# Patient Record
Sex: Female | Born: 1965 | Race: White | Hispanic: No | Marital: Married | Smoking: Current every day smoker
Health system: Southern US, Community
[De-identification: ages and names within clinical notes are randomized; demographics above are authoritative.]

## PROBLEM LIST (undated history)

## (undated) DIAGNOSIS — J45909 Unspecified asthma, uncomplicated: Secondary | ICD-10-CM

## (undated) DIAGNOSIS — R112 Nausea with vomiting, unspecified: Secondary | ICD-10-CM

## (undated) DIAGNOSIS — E039 Hypothyroidism, unspecified: Secondary | ICD-10-CM

## (undated) DIAGNOSIS — Z9889 Other specified postprocedural states: Secondary | ICD-10-CM

## (undated) HISTORY — DX: Hypothyroidism, unspecified: E03.9

## (undated) HISTORY — PX: CHOLECYSTECTOMY: SHX55

## (undated) HISTORY — PX: HERNIA REPAIR: SHX51

---

## 1998-07-18 ENCOUNTER — Ambulatory Visit (HOSPITAL_COMMUNITY): Admission: RE | Admit: 1998-07-18 | Discharge: 1998-07-18 | Payer: Self-pay | Admitting: Internal Medicine

## 1998-07-18 ENCOUNTER — Encounter: Payer: Self-pay | Admitting: Internal Medicine

## 1999-12-31 ENCOUNTER — Other Ambulatory Visit: Admission: RE | Admit: 1999-12-31 | Discharge: 1999-12-31 | Payer: Self-pay | Admitting: Internal Medicine

## 2015-12-31 ENCOUNTER — Encounter (HOSPITAL_COMMUNITY): Payer: Self-pay

## 2015-12-31 ENCOUNTER — Emergency Department (HOSPITAL_COMMUNITY)
Admission: EM | Admit: 2015-12-31 | Discharge: 2015-12-31 | Disposition: A | Discharge status: Home / Self Care | Payer: No Typology Code available for payment source | Attending: Emergency Medicine | Admitting: Emergency Medicine

## 2015-12-31 ENCOUNTER — Emergency Department (HOSPITAL_COMMUNITY): Payer: No Typology Code available for payment source

## 2015-12-31 DIAGNOSIS — F17200 Nicotine dependence, unspecified, uncomplicated: Secondary | ICD-10-CM | POA: Diagnosis not present

## 2015-12-31 DIAGNOSIS — Z79899 Other long term (current) drug therapy: Secondary | ICD-10-CM | POA: Insufficient documentation

## 2015-12-31 DIAGNOSIS — S5011XA Contusion of right forearm, initial encounter: Secondary | ICD-10-CM | POA: Insufficient documentation

## 2015-12-31 DIAGNOSIS — Y9389 Activity, other specified: Secondary | ICD-10-CM | POA: Insufficient documentation

## 2015-12-31 DIAGNOSIS — Y999 Unspecified external cause status: Secondary | ICD-10-CM | POA: Diagnosis not present

## 2015-12-31 DIAGNOSIS — S59911A Unspecified injury of right forearm, initial encounter: Secondary | ICD-10-CM | POA: Diagnosis present

## 2015-12-31 DIAGNOSIS — J45909 Unspecified asthma, uncomplicated: Secondary | ICD-10-CM | POA: Diagnosis not present

## 2015-12-31 DIAGNOSIS — S8001XA Contusion of right knee, initial encounter: Secondary | ICD-10-CM | POA: Diagnosis not present

## 2015-12-31 DIAGNOSIS — Y92410 Unspecified street and highway as the place of occurrence of the external cause: Secondary | ICD-10-CM | POA: Diagnosis not present

## 2015-12-31 DIAGNOSIS — T07XXXA Unspecified multiple injuries, initial encounter: Secondary | ICD-10-CM

## 2015-12-31 HISTORY — DX: Unspecified asthma, uncomplicated: J45.909

## 2015-12-31 MED ORDER — OXYCODONE-ACETAMINOPHEN 5-325 MG PO TABS
1.0000 | ORAL_TABLET | Freq: Once | ORAL | Status: AC
  Administered 2015-12-31: 1 via ORAL
  Filled 2015-12-31: qty 1

## 2015-12-31 MED ORDER — TRAMADOL HCL 50 MG PO TABS: 50.0000 mg | ORAL_TABLET | Freq: Four times a day (QID) | ORAL | Status: DC | PRN

## 2015-12-31 MED ORDER — IBUPROFEN 800 MG PO TABS: 800.0000 mg | ORAL_TABLET | Freq: Three times a day (TID) | ORAL | Status: DC

## 2015-12-31 MED ORDER — CYCLOBENZAPRINE HCL 10 MG PO TABS: 10.0000 mg | ORAL_TABLET | Freq: Three times a day (TID) | ORAL | Status: DC | PRN
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
## 2021-10-07 ENCOUNTER — Ambulatory Visit
Admission: EM | Admit: 2021-10-07 | Discharge: 2021-10-07 | Disposition: A | Discharge status: Home / Self Care | Payer: 59 | Attending: Family Medicine | Admitting: Family Medicine

## 2021-10-07 ENCOUNTER — Other Ambulatory Visit: Payer: Self-pay

## 2021-10-07 ENCOUNTER — Encounter: Payer: Self-pay | Admitting: Emergency Medicine

## 2021-10-07 DIAGNOSIS — M79605 Pain in left leg: Secondary | ICD-10-CM

## 2021-10-07 MED ORDER — CYCLOBENZAPRINE HCL 10 MG PO TABS: 10.0000 mg | ORAL_TABLET | Freq: Two times a day (BID) | ORAL | 0 refills | Status: DC | PRN

## 2021-10-07 NOTE — ED Triage Notes (Signed)
Pt reports left groin pain since Monday. Pt denies any known injury and reports pain is worse with standing and reports "knot" sensation intensify's between legs when standing.  ?

## 2021-10-07 NOTE — ED Provider Notes (Signed)
?[facility] ? ? ? ?CSN: [id number] ?Arrival date & time: 10/07/21  1533 ? ? ?  ? ?History   ?Chief Complaint ?Chief Complaint  ?Patient presents with  ? Groin Pain  ? ? ?HPI ?Selena Bonilla is a 56 y.o. female.  ? ?Presenting today with several day history of left upper leg pain, stiffness.  She states she is unaware of any injury to the area though has been incredibly active lately.  She states when she sits on a hard surface it feels like there is a knot to the upper posterior leg and the medial upper leg though she does not feel well when she presses on the area.  Denies any numbness, tingling, weakness, radiation of pain down the leg, discoloration, history of similar issues.  Tried Tylenol once or twice but states it did not help so she has not been trying anything over-the-counter for symptoms. ? ? ?Past Medical History:  ?Diagnosis Date  ? Asthma   ? ? ?There are no problems to display for this patient. ? ? ?Past Surgical History:  ?Procedure Laterality Date  ? CESAREAN SECTION    ? CHOLECYSTECTOMY    ? HERNIA REPAIR    ? ? ?OB History   ?No obstetric history on file. ?  ? ? ? ?Home Medications   ? ?Prior to Admission medications   ?Medication Sig Start Date End Date Taking? Authorizing Provider  ?cyclobenzaprine (FLEXERIL) 10 MG tablet Take 1 tablet (10 mg total) by mouth 2 (two) times daily as needed for muscle spasms. Do not drink alcohol or drive while taking this medication.  May cause drowsiness. 10/07/21  Yes Particia NearingLane, Signora Zucco Elizabeth, PA-C  ?albuterol (PROVENTIL HFA;VENTOLIN HFA) 108 (90 Base) MCG/ACT inhaler Inhale into the lungs every 6 (six) hours as needed for wheezing or shortness of breath.    [provider]  ?cyclobenzaprine (FLEXERIL) 10 MG tablet Take 1 tablet (10 mg total) by mouth 3 (three) times daily as needed for muscle spasms. 12/31/15   Gilda CreasePollina, Christopher J, MD  ?Fluticasone-Salmeterol (ADVAIR) 100-50 MCG/DOSE AEPB Inhale 1 puff into the lungs 2 (two) times daily.     [provider]  ?ibuprofen (ADVIL,MOTRIN) 800 MG tablet Take 1 tablet (800 mg total) by mouth 3 (three) times daily. 12/31/15   Gilda CreasePollina, Christopher J, MD  ?traMADol (ULTRAM) 50 MG tablet Take 1 tablet (50 mg total) by mouth every 6 (six) hours as needed. 12/31/15   Gilda CreasePollina, Christopher J, MD  ? ? ?Family History ?History reviewed. No pertinent family history. ? ?Social History ?Social History  ? ?Tobacco Use  ? Smoking status: Every Day  ?Substance Use Topics  ? Alcohol use: No  ? ? ? ?Allergies   ?Patient has no known allergies. ? ? ?Review of Systems ?Review of Systems ?Per HPI ? ?Physical Exam ?Triage Vital Signs ?ED Triage Vitals  ?Enc Vitals Group  ?   BP 10/07/21 1603 136/80  ?   Pulse Rate 10/07/21 1603 100  ?   Resp 10/07/21 1603 18  ?   Temp 10/07/21 1603 98.1 ?F (36.7 ?C)  ?   Temp Source 10/07/21 1603 Oral  ?   SpO2 10/07/21 1603 94 %  ?   Weight 10/07/21 1604 175 lb (79.4 kg)  ?   Height 10/07/21 1604 5\' 10"  (1.778 m)  ?   Head Circumference --   ?   Peak Flow --   ?   Pain Score 10/07/21 1604 3  ?  Pain Loc --   ?   Pain Edu? --   ?   Excl. in GC? --   ? ?No data found. ? ?Updated Vital Signs ?BP 136/80 (BP Location: Right Arm)   Pulse 100   Temp 98.1 ?F (36.7 ?C) (Oral)   Resp 18   Ht 5\' 10"  (1.778 m)   Wt 175 lb (79.4 kg)   SpO2 94%   BMI 25.11 kg/m?  ? ?Visual Acuity ?Right Eye Distance:   ?Left Eye Distance:   ?Bilateral Distance:   ? ?Right Eye Near:   ?Left Eye Near:    ?Bilateral Near:    ? ?Physical Exam ?Vitals and nursing note reviewed.  ?Constitutional:   ?   Appearance: Normal appearance. She is not ill-appearing.  ?HENT:  ?   Head: Atraumatic.  ?   Mouth/Throat:  ?   Mouth: Mucous membranes are moist.  ?Eyes:  ?   Extraocular Movements: Extraocular movements intact.  ?   Conjunctiva/sclera: Conjunctivae normal.  ?Cardiovascular:  ?   Rate and Rhythm: Normal rate and regular rhythm.  ?   Heart sounds: Normal heart sounds.  ?Pulmonary:  ?   Effort: Pulmonary effort is normal.   ?   Breath sounds: Normal breath sounds.  ?Musculoskeletal:     ?   General: Tenderness present. No swelling, deformity or signs of injury. Normal range of motion.  ?   Cervical back: Normal range of motion and neck supple.  ?   Comments: Tender to palpation left central posterior upper leg and left medial upper leg though no palpable masses in this area, discoloration, skin changes.  Range of motion full and intact, strength full and intact bilateral lower legs ?Negative Homans' sign, negative squeeze test bilateral lower legs  ?Skin: ?   General: Skin is warm and dry.  ?   Findings: No bruising or erythema.  ?Neurological:  ?   Mental Status: She is alert and oriented to person, place, and time.  ?   Motor: No weakness.  ?   Gait: Gait normal.  ?   Comments: Bilateral lower extremities neurovascularly intact  ?Psychiatric:     ?   Mood and Affect: Mood normal.     ?   Thought Content: Thought content normal.     ?   Judgment: Judgment normal.  ? ? ? ?UC Treatments / Results  ?Labs ?(all labs ordered are listed, but only abnormal results are displayed) ?Labs Reviewed - No data to display ? ?EKG ? ? ?Radiology ?No results found. ? ?Procedures ?Procedures (including critical care time) ? ?Medications Ordered in UC ?Medications - No data to display ? ?Initial Impression / Assessment and Plan / UC Course  ?I have reviewed the triage vital signs and the nursing notes. ? ?Pertinent labs & imaging results that were available during my care of the patient were reviewed by me and considered in my medical decision making (see chart for details). ? ?  ? ?Exam and symptoms consistent with muscular etiology of pain, no red flag findings today on exam and vital signs benign and reassuring.  We will treat with Flexeril, heat, stretches, massage, rest and monitor closely for symptomatic improvement.  Discussed ED for any worsening symptoms, PCP follow-up next week for recheck. ? ?Final Clinical Impressions(s) / UC Diagnoses   ? ?Final diagnoses:  ?Left leg pain  ? ?Discharge Instructions   ?None ?  ? ?ED Prescriptions   ? ? Medication Sig Dispense Auth. Provider  ?  cyclobenzaprine (FLEXERIL) 10 MG tablet Take 1 tablet (10 mg total) by mouth 2 (two) times daily as needed for muscle spasms. Do not drink alcohol or drive while taking this medication.  May cause drowsiness. 20 tablet Particia Nearing, [place]  ? ?  ? ?PDMP not reviewed this encounter. ?  ?Particia Nearing, PA-C ?10/07/21 1711 ? ?

## 2022-06-24 ENCOUNTER — Other Ambulatory Visit: Payer: Self-pay

## 2022-06-24 ENCOUNTER — Emergency Department (HOSPITAL_COMMUNITY): Payer: 59

## 2022-06-24 ENCOUNTER — Emergency Department (HOSPITAL_COMMUNITY)
Admission: EM | Admit: 2022-06-24 | Discharge: 2022-06-25 | Disposition: A | Discharge status: Home / Self Care | Payer: 59 | Attending: Emergency Medicine | Admitting: Emergency Medicine

## 2022-06-24 ENCOUNTER — Encounter (HOSPITAL_COMMUNITY): Payer: Self-pay | Admitting: *Deleted

## 2022-06-24 DIAGNOSIS — W01198A Fall on same level from slipping, tripping and stumbling with subsequent striking against other object, initial encounter: Secondary | ICD-10-CM | POA: Insufficient documentation

## 2022-06-24 DIAGNOSIS — S42432A Displaced fracture (avulsion) of lateral epicondyle of left humerus, initial encounter for closed fracture: Secondary | ICD-10-CM | POA: Diagnosis not present

## 2022-06-24 DIAGNOSIS — M7989 Other specified soft tissue disorders: Secondary | ICD-10-CM | POA: Diagnosis present

## 2022-06-24 MED ORDER — HYDROCODONE-ACETAMINOPHEN 5-325 MG PO TABS
2.0000 | ORAL_TABLET | Freq: Once | ORAL | Status: AC
  Administered 2022-06-24: 2 via ORAL
  Filled 2022-06-24: qty 2

## 2022-06-24 MED ORDER — HYDROCODONE-ACETAMINOPHEN 5-325 MG PO TABS: 1.0000 | ORAL_TABLET | ORAL | 0 refills | Status: DC | PRN

## 2022-06-24 NOTE — ED Notes (Signed)
Pt ambulatory to bathroom with spouse and back to room

## 2022-06-24 NOTE — ED Triage Notes (Signed)
Pt fell today while cleaning out a van, pt with left elbow pain and left wrist pain.

## 2022-06-24 NOTE — ED Provider Notes (Signed)
[facility] EMERGENCY DEPARTMENT Provider Note   CSN: [id number] Arrival date & time: 06/24/22  1633     History  Chief Complaint  Patient presents with   Selena Bonilla is a 56 y.o. female.  Patient reports that she slipped and fell and struck her left elbow.  Patient complains of swelling and pain.  Patient reports pain radiates down to her wrist and hand.  Patient did not strike her head she denies any other areas of injury.  The history is provided by the patient. No language interpreter was used.  Fall This is a new problem. The current episode started 6 to 12 hours ago. The problem occurs constantly. The problem has not changed since onset.Nothing aggravates the symptoms. Nothing relieves the symptoms. She has tried nothing for the symptoms. The treatment provided no relief.       Home Medications Prior to Admission medications   Medication Sig Start Date End Date Taking? Authorizing Provider  albuterol (PROVENTIL HFA;VENTOLIN HFA) 108 (90 Base) MCG/ACT inhaler Inhale into the lungs every 6 (six) hours as needed for wheezing or shortness of breath.    [provider]  cyclobenzaprine (FLEXERIL) 10 MG tablet Take 1 tablet (10 mg total) by mouth 3 (three) times daily as needed for muscle spasms. 12/31/15   Gilda Crease, MD  cyclobenzaprine (FLEXERIL) 10 MG tablet Take 1 tablet (10 mg total) by mouth 2 (two) times daily as needed for muscle spasms. Do not drink alcohol or drive while taking this medication.  May cause drowsiness. 10/07/21   Particia Nearing, PA-C  Fluticasone-Salmeterol (ADVAIR) 100-50 MCG/DOSE AEPB Inhale 1 puff into the lungs 2 (two) times daily.    [provider]  ibuprofen (ADVIL,MOTRIN) 800 MG tablet Take 1 tablet (800 mg total) by mouth 3 (three) times daily. 12/31/15   Gilda Crease, MD  traMADol (ULTRAM) 50 MG tablet Take 1 tablet (50 mg total) by mouth every 6 (six) hours as needed. 12/31/15   Gilda Crease, MD      Allergies    Patient has no known allergies.    Review of Systems   Review of Systems  All other systems reviewed and are negative.   Physical Exam Updated Vital Signs BP 130/87   Pulse 78   Temp 97.8 F (36.6 C) (Oral)   Resp 17   Ht 5\' 10"  (1.778 m)   Wt 96.2 kg   SpO2 100%   BMI 30.42 kg/m  Physical Exam Vitals and nursing note reviewed.  Constitutional:      Appearance: She is well-developed.  HENT:     Head: Normocephalic.     Mouth/Throat:     Mouth: Mucous membranes are moist.  Pulmonary:     Effort: Pulmonary effort is normal.  Abdominal:     General: There is no distension.  Musculoskeletal:        General: Swelling and tenderness present.     Cervical back: Normal range of motion.  Neurological:     Mental Status: She is alert and oriented to person, place, and time.     ED Results / Procedures / Treatments   Labs (all labs ordered are listed, but only abnormal results are displayed) Labs Reviewed - No data to display  EKG None  Radiology CT Elbow Left Wo Contrast  Result Date: 06/24/2022 CLINICAL DATA:  Fracture elbow. Patient fell today while cleaning out a vein. Left elbow pain. EXAM: CT OF THE  UPPER LEFT EXTREMITY WITHOUT CONTRAST TECHNIQUE: Multidetector CT imaging of the upper left extremity was performed according to the standard protocol. RADIATION DOSE REDUCTION: This exam was performed according to the departmental dose-optimization program which includes automated exposure control, adjustment of the mA and/or kV according to patient size and/or use of iterative reconstruction technique. COMPARISON:  None Available. FINDINGS: Bones/Joint/Cartilage Highly comminuted fracture of the lateral epicondyle, trochlea as well as capitellum. 2.0 x 1.2 cm displaced osseous fragment about the anterior aspect of the distal humerus. There is also 0.9 x 0.6 cm posteriorly displaced fragment likely from the trochlea. There is mildly  displaced fracture of the radial head. There is contour irregularity about the insertion of the triceps tendon concerning for avulsion fracture of the ulna. Moderate-sized elbow joint effusion. Ligaments Suboptimally assessed by CT. Muscles and Tendons Muscles are normal in bulk. No intramuscular hematoma or fluid collection. Irregularity about the insertion of the triceps tendon concerning for avulsion fracture. Evaluation of biceps tendon is limited due to positioning. Soft tissues Marked subcutaneous soft tissue edema about the medial and lateral aspect of the elbow. No appreciable fluid collection or hematoma. IMPRESSION: 1. Highly comminuted fracture of the distal humerus involving the lateral epicondyle, trochlea as well as capitellum with displaced osseous fragments as described above. 2. Mildly displaced fracture of the radial head. 3. Contour irregularity about the insertion of the triceps tendon concerning for avulsion fracture of the ulnar. 4. Moderate-sized elbow joint effusion. 5. Marked subcutaneous soft tissue edema about the medial and lateral aspect of the elbow. Electronically Signed   By: Larose Hires D.O.   On: 06/24/2022 22:52   DG Wrist Complete Left  Result Date: 06/24/2022 CLINICAL DATA:  Fall, wrist pain EXAM: LEFT WRIST - COMPLETE 3+ VIEW COMPARISON:  12/31/2015 FINDINGS: Mild spurring at the first carpometacarpal articulation. No well-defined fracture is identified. Please note that a dedicated scaphoid view was not obtained. IMPRESSION: 1. No well-defined fracture is identified. Please note that a dedicated scaphoid view was not obtained. 2. Mild spurring at the first carpometacarpal articulation. Electronically Signed   By: Gaylyn Rong M.D.   On: 06/24/2022 18:11   DG Elbow Complete Left  Result Date: 06/24/2022 CLINICAL DATA:  Fall, left elbow pain and wrist pain. EXAM: LEFT ELBOW - COMPLETE 3+ VIEW COMPARISON:  12/31/2015 FINDINGS: Comminuted fracture the lateral  humeral epicondyle, trochlea, and capitellum with one fragment displaced laterally as shown on the frontal projection, and a large articular fragment displaced anteriorly best seen on the lateral projection. There is clustered irregular posterior fragments posterior to the elbow joint along with elbow joint effusion and questionable additional fragments. Although most of this appears to have a lateral donor site, a supracondylar fracture is not excluded given the degree of deformity and irregularity in the region. CT may help further clarify. IMPRESSION: 1. Comminuted fracture of the lateral humeral epicondyle, trochlea, and capitellum with one fragment displaced laterally and a large articular fragment anteriorly displaced anteriorly. 2. Clustered irregular fragments posterior to the elbow joint along with elbow joint effusion. Although most of this appears to have a lateral donor site, a supracondylar fracture is not excluded. Consider CT for further characterization. Electronically Signed   By: Gaylyn Rong M.D.   On: 06/24/2022 18:09    Procedures Procedures    Medications Ordered in ED Medications  HYDROcodone-acetaminophen (NORCO/VICODIN) 5-325 MG per tablet 2 tablet (2 tablets Oral Given 06/24/22 2155)    ED Course/ Medical Decision Making/ A&P  Medical Decision Making Patient reports she slipped and fell while cleaning out her Zenaida Niece.  Patient reports striking her elbow and hearing a pop  Amount and/or Complexity of Data Reviewed Independent Historian: spouse    Details: Patient is here with her husband who is supportive Radiology: ordered and independent interpretation performed. Decision-making details documented in ED Course.    Details: X-ray of left elbow shows lateral epicondyles fracture.  Radiologist suggested CT scan.  CT scan shows highly comminuted fracture lateral epicondyles.  Risk Prescription drug management. Risk Details: Patient placed in  a posterior splint.  Patient is given 2 hydrocodone here for pain patient is given a prescription for hydrocodone.  She is advised to call Dr. Romeo Apple orthopedist on Monday to schedule appointment for evaluation.  Patient is given a prescription for hydrocodone.  Patient is advised ice to area of swelling           Final Clinical Impression(s) / ED Diagnoses Final diagnoses:  Closed displaced fracture of lateral epicondyle of left humerus, unspecified fracture morphology, initial encounter    Rx / DC Orders ED Discharge Orders          Ordered    HYDROcodone-acetaminophen (NORCO/VICODIN) 5-325 MG tablet  Every 4 hours PRN        06/24/22 2307           An After Visit Summary was printed and given to the patient.    Osie Cheeks 06/24/22 2310    Rondel Baton, MD 06/26/22 1538

## 2022-06-24 NOTE — Discharge Instructions (Signed)
Ice to area of swelling.  Schedule to see the Orthopaedist for evaluation.

## 2022-06-27 ENCOUNTER — Telehealth: Payer: Self-pay | Admitting: Orthopedic Surgery

## 2022-06-27 NOTE — Telephone Encounter (Signed)
Patient called and left a voicemail stating she fell and broke her elbow last Friday and needs an appt   I called the patient back and got her voicemail left message to call the office

## 2022-06-29 ENCOUNTER — Ambulatory Visit: Payer: 59 | Admitting: Orthopedic Surgery

## 2022-06-29 ENCOUNTER — Encounter: Payer: Self-pay | Admitting: Orthopedic Surgery

## 2022-06-29 VITALS — BP 130/87 | Ht 70.0 in | Wt 212.0 lb

## 2022-06-29 DIAGNOSIS — S42412A Displaced simple supracondylar fracture without intercondylar fracture of left humerus, initial encounter for closed fracture: Secondary | ICD-10-CM

## 2022-06-29 NOTE — Progress Notes (Signed)
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  She comes in with a ill fitting posterior splint complaining of trouble sleeping at night because of the injury and positioning.  Otherwise during the day hydrocodone has controlled the pain  She comes in for evaluation and management  The review of systems revealed no chest pain or shortness of breath no fever no easy bruising or bleeding no anesthetic complications noted in her family history  She does have smoking history smokes every day cigarettes.  She has not been tested for osteoporosis  Past Medical History:  Diagnosis Date   Asthma    Past Surgical History:  Procedure Laterality Date   CESAREAN SECTION     CHOLECYSTECTOMY     HERNIA REPAIR     History reviewed. No pertinent family history. Social History   Tobacco Use   Smoking status: Every Day    Types: Cigarettes  Substance Use Topics   Alcohol use: No   No Known Allergies BP 130/87 Comment: in ER  Ht 5\' 10"  (1.778 m)   Wt 212 lb (96.2 kg)   BMI 30.42 kg/m   Physical Exam Vitals and nursing note reviewed.  Constitutional:      Appearance: Normal appearance.  HENT:     Head: Normocephalic and atraumatic.  Eyes:     General: No scleral icterus.       Right eye: No discharge.        Left eye: No discharge.     Extraocular Movements: Extraocular movements intact.     Conjunctiva/sclera: Conjunctivae normal.     Pupils: Pupils are equal, round, and reactive to light.  Cardiovascular:     Rate and Rhythm: Normal rate.     Pulses: Normal pulses.  Musculoskeletal:     Comments: Left elbow splint was removed and skin was checked there was a faint hue of yellow discoloration in the arm around the elbow secondary to fracture related bleeding and swelling.  The skin was otherwise intact.  The median ulnar and radial  nerve sensation was intact.  She can move her hand but she had some stiffness from swelling and from the application of the splint    Skin:    General: Skin is warm and dry.     Capillary Refill: Capillary refill takes less than 2 seconds.  Neurological:     General: No focal deficit present.     Mental Status: She is alert and oriented to person, place, and time.  Psychiatric:        Mood and Affect: Mood normal.        Behavior: Behavior normal.        Thought Content: Thought content normal.        Judgment: Judgment normal.     Outside imaging requires personal interpretation: My interpretation is: Plain radiographs show a comminuted fracture of the left elbow  CT scan was also evaluated and it showed the same findings with Displaced radial head fracture lateral epicondyle trochlea and capitellar fracture   Encounter Diagnosis  Name Primary?   Closed supracondylar fracture of left elbow, initial encounter Yes    CT scan report 1. Highly comminuted fracture of the distal humerus involving the lateral epicondyle, trochlea as well as capitellum with displaced osseous fragments as described above. 2. Mildly  displaced fracture of the radial head. 3. Contour irregularity about the insertion of the triceps tendon concerning for avulsion fracture of the ulnar. 4. Moderate-sized elbow joint effusion. 5. Marked subcutaneous soft tissue edema about the medial and lateral aspect of the elbow.     Electronically Signed   By: Larose Hires D.O.   On: 06/24/2022 22:52    As this fracture requires a high level of expertise for reconstruction of the elbow I have informed the patient that we will make a referral.  She is in agreement.  We redid her splint to a more appropriate forming and fitting splint

## 2022-06-29 NOTE — Patient Instructions (Signed)
[facility] will call you with appointment. If you have not heard from them in 2-3 business days call them to schedule [phone]   

## 2022-06-30 ENCOUNTER — Encounter (HOSPITAL_BASED_OUTPATIENT_CLINIC_OR_DEPARTMENT_OTHER): Payer: Self-pay | Admitting: Orthopaedic Surgery

## 2022-07-01 NOTE — Telephone Encounter (Signed)
I called her to discuss referral [facility] could not help her She has established with Dr Everardo Pacific   To you FYI only

## 2022-07-03 NOTE — H&P (Signed)
PREOPERATIVE H&P  Chief Complaint: left elbow radial head fracture, ligament tear, unicondylar elbow fracture  HPI: Selena Bonilla is a 56 y.o. female who is scheduled for, Procedure(s): OPEN REDUCTION INTERNAL FIXATION (ORIF) DISTAL HUMERUS FRACTURE OPEN REDUCTION INTERNAL FIXATION (ORIF) RADIAL FRACTURE TRICEPS TENDON REPAIR LIGAMENT REPAIR.   Patient has a past medical history significant for PONV.   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 She went to [facility] ED. Work-up in the ED was significant for distal humerus fracture. She was splinted and told to follow-up with Orthopedics.   Symptoms are rated as moderate to severe, and have been worsening.  This is significantly impairing activities of daily living.    Please see clinic note for further details on this patient's care.    She has elected for surgical management.   Past Medical History:  Diagnosis Date   Asthma    PONV (postoperative nausea and vomiting)    Past Surgical History:  Procedure Laterality Date   CESAREAN SECTION     CHOLECYSTECTOMY     HERNIA REPAIR     Social History   Socioeconomic History   Marital status: Married    Spouse name: Not on file   Number of children: Not on file   Years of education: Not on file   Highest education level: Not on file  Occupational History   Not on file  Tobacco Use   Smoking status: Every Day    Packs/day: 0.50    Types: Cigarettes   Smokeless tobacco: Not on file  Substance and Sexual Activity   Alcohol use: Yes    Comment: occasional   Drug use: Never   Sexual activity: Not on file  Other Topics Concern   Not on file  Social History Narrative   Not on file   Social Determinants of Health   Financial Resource Strain: Not on file  Food Insecurity: Not on file  Transportation Needs: Not on file  Physical Activity: Not on file  Stress: Not on file  Social Connections: Not on file   History reviewed.  No pertinent family history. No Known Allergies Prior to Admission medications   Medication Sig Start Date End Date Taking? Authorizing Provider  albuterol (PROVENTIL HFA;VENTOLIN HFA) 108 (90 Base) MCG/ACT inhaler Inhale into the lungs every 6 (six) hours as needed for wheezing or shortness of breath.   Yes [provider]  budesonide-formoterol (SYMBICORT) 80-4.5 MCG/ACT inhaler Inhale 2 puffs into the lungs 2 (two) times daily.   Yes [provider]  buPROPion (WELLBUTRIN XL) 150 MG 24 hr tablet Take 150 mg by mouth daily. 04/06/22  Yes [provider]  ibuprofen (ADVIL,MOTRIN) 800 MG tablet Take 1 tablet (800 mg total) by mouth 3 (three) times daily. 12/31/15  Yes Pollina, Canary Brim, MD  Fluticasone-Salmeterol (ADVAIR) 100-50 MCG/DOSE AEPB Inhale 1 puff into the lungs 2 (two) times daily. Patient not taking: Reported on 06/30/2022    [provider]  HYDROcodone-acetaminophen (NORCO/VICODIN) 5-325 MG tablet Take 1 tablet by mouth every 4 (four) hours as needed for moderate pain. 06/24/22 06/24/23  Elson Areas, PA-C    ROS: All other systems have been reviewed and were otherwise negative with the exception of those mentioned in the HPI and as above.  Physical Exam: General: Alert, no acute distress Cardiovascular: No pedal edema Respiratory: No cyanosis, no use of accessory musculature GI: No organomegaly, abdomen is soft and non-tender  Skin: No lesions in the area of chief complaint Neurologic: Sensation intact distally Psychiatric: Patient is competent for consent with normal mood and affect Lymphatic: No axillary or cervical lymphadenopathy  MUSCULOSKELETAL:  LUE: Splint CDI. Skin intact though cannot assess fully beneath splint. Nontender to palpation proximally, with full and painless ROM throughout hand. Distal motor and sensory function grossly intact. Well perfused digits.    Imaging: CT of the left elbow demonstrates a comminuted  fracture of the distal humerus, mildly displaced radial head fracture, triceps tendon tear  Assessment: left elbow radial head fracture, ligament tear, unicondylar elbow fracture  Plan: Plan for Procedure(s): OPEN REDUCTION INTERNAL FIXATION (ORIF) DISTAL HUMERUS FRACTURE OPEN REDUCTION INTERNAL FIXATION (ORIF) RADIAL FRACTURE TRICEPS TENDON REPAIR LIGAMENT REPAIR  The risks benefits and alternatives were discussed with the patient including but not limited to the risks of nonoperative treatment, versus surgical intervention including infection, bleeding, nerve injury,  blood clots, cardiopulmonary complications, morbidity, mortality, among others, and they were willing to proceed.   The patient acknowledged the explanation, agreed to proceed with the plan and consent was signed.   Operative Plan: ORIF distal humerus and radial head, lateral ligament repair, triceps tendon repair Discharge Medications: Standard DVT Prophylaxis: none Physical Therapy: outpatient PT Special Discharge needs: Splint. Sling    Vernetta Honey, PA-C  07/03/2022 2:05 PM

## 2022-07-03 NOTE — Discharge Instructions (Addendum)
Ramond Marrow MD, MPH Alfonse Alpers, PA-C [facility] [address], [address] [phone] (tel)   [phone] (fax)   POST-OPERATIVE INSTRUCTIONS   WOUND CARE Please keep splint clean dry and intact until followup.  You may shower on Post-Op Day #3.  You must keep splint dry during this process and may find that a plastic bag taped around the extremity or alternatively a towel based bath may be a better option.   If you get your splint wet or if it is damaged please contact our clinic.  EXERCISES Due to your splint being in place you will not be able to bear weight through your extremity.    You may use a sling for comfort   It is normal for your fingers/hand to become more swollen after surgery and discolored from bruising.   This will resolve over the first few weeks usually after surgery. Please continue to ambulate and do not stay sitting or lying for too long.  Perform foot and wrist pumps to assist in circulation.   REGIONAL ANESTHESIA (NERVE BLOCKS) The anesthesia team may have performed a nerve block for you this is a great tool used to minimize pain.   The block may start wearing off overnight (between 8-24 hours postop) When the block wears off, your pain may go from nearly zero to the pain you would have had postop without the block. This is an abrupt transition but nothing dangerous is happening.   This can be a challenging period but utilize your as needed pain medications to try and manage this period. We suggest you use the pain medication the first night prior to going to bed, to ease this transition.  You may take an extra dose of narcotic when this happens if needed   POST-OP MEDICATIONS- Multimodal approach to pain control In general your pain will be controlled with a combination of substances.  Prescriptions unless otherwise discussed are electronically sent to your pharmacy.  This is a carefully made plan we use to minimize  narcotic use.     Celebrex - Anti-inflammatory medication taken on a scheduled basis Acetaminophen - Non-narcotic pain medicine taken on a scheduled basis  Gabapentin - this is to help with nerve based pain, take on a scheduled basis Oxycodone - This is a strong narcotic, to be used only on an "as needed" basis for SEVERE pain. Zofran - take as needed for nausea   FOLLOW-UP If you develop a Fever (>101.5), Redness or Drainage from the surgical incision site, please call our office to arrange for an evaluation. Please call the office to schedule a follow-up appointment for your incision check if you do not already have one, 7-10 days post-operatively.   HELPFUL INFORMATION    You may be more comfortable sleeping in a semi-seated position the first few nights following surgery.  Keep a pillow propped under the elbow and forearm for comfort.  If you have a recliner type of chair it might be beneficial.  If not that is fine too, but it would be helpful to sleep propped up with pillows behind your operated shoulder as well under your elbow and forearm.  This will reduce pulling on the suture lines.   When dressing, put your operative arm in the sleeve first.  When getting undressed, take your operative arm out last.  Loose fitting, button-down shirts are recommended.  Often in the first days after surgery you may be more comfortable keeping your operative arm under your  shirt and not through the sleeve.   You may return to work/school in the next couple of days when you feel up to it.  Desk work and typing in the sling is fine.   We suggest you use the pain medication the first night prior to going to bed, in order to ease any pain when the anesthesia wears off. You should avoid taking pain medications on an empty stomach as it will make you nauseous.   You should wean off your narcotic medicines as soon as you are able.  Most patients will be off or using minimal narcotics before their first  postop appointment.    Do not drink alcoholic beverages or take illicit drugs when taking pain medications.   It is against the law to drive while taking narcotics.  In some states it is against the law to drive while your arm is in a sling.    Pain medication may make you constipated.  Below are a few solutions to try in this order:   - Decrease the amount of pain medication if you aren't having pain.   - Drink lots of decaffeinated fluids.   - Drink prune juice and/or each dried prunes   If the first 3 don't work start with additional solutions   - Take Colace - an over-the-counter stool softener   - Take Senokot - an over-the-counter laxative   - Take Miralax - a stronger over-the-counter laxative   For more information including helpful videos and documents visit our website:   [email or web address]    Post Anesthesia Home Care Instructions  Activity: Get plenty of rest for the remainder of the day. A responsible individual must stay with you for 24 hours following the procedure.  For the next 24 hours, DO NOT: -Drive a car -Advertising copywriter -Drink alcoholic beverages -Take any medication unless instructed by your physician -Make any legal decisions or sign important papers.  Meals: Start with liquid foods such as gelatin or soup. Progress to regular foods as tolerated. Avoid greasy, spicy, heavy foods. If nausea and/or vomiting occur, drink only clear liquids until the nausea and/or vomiting subsides. Call your physician if vomiting continues.  Special Instructions/Symptoms: Your throat may feel dry or sore from the anesthesia or the breathing tube placed in your throat during surgery. If this causes discomfort, gargle with warm salt water. The discomfort should disappear within 24 hours.  If you had a scopolamine patch placed behind your ear for the management of post- operative nausea and/or vomiting:  1. The medication in the patch is  effective for 72 hours, after which it should be removed.  Wrap patch in a tissue and discard in the trash. Wash hands thoroughly with soap and water. 2. You may remove the patch earlier than 72 hours if you experience unpleasant side effects which may include dry mouth, dizziness or visual disturbances. 3. Avoid touching the patch. Wash your hands with soap and water after contact with the patch.     Regional Anesthesia Blocks  1. Numbness or the inability to move the "blocked" extremity may last from 3-48 hours after placement. The length of time depends on the medication injected and your individual response to the medication. If the numbness is not going away after 48 hours, call your surgeon.  2. The extremity that is blocked will need to be protected until the numbness is gone and the  Strength has returned. Because you cannot feel it, you will need to take extra care  to avoid injury. Because it may be weak, you may have difficulty moving it or using it. You may not know what position it is in without looking at it while the block is in effect.  3. For blocks in the legs and feet, returning to weight bearing and walking needs to be done carefully. You will need to wait until the numbness is entirely gone and the strength has returned. You should be able to move your leg and foot normally before you try and bear weight or walk. You will need someone to be with you when you first try to ensure you do not fall and possibly risk injury.  4. Bruising and tenderness at the needle site are common side effects and will resolve in a few days.  5. Persistent numbness or new problems with movement should be communicated to the surgeon or the [facility] [phone] [facility] [phone]). Next tylenol dose any time. Next motrin dose 8pm.

## 2022-07-05 ENCOUNTER — Encounter (HOSPITAL_BASED_OUTPATIENT_CLINIC_OR_DEPARTMENT_OTHER): Payer: Self-pay | Admitting: Orthopaedic Surgery

## 2022-07-05 ENCOUNTER — Ambulatory Visit (HOSPITAL_BASED_OUTPATIENT_CLINIC_OR_DEPARTMENT_OTHER)
Admission: RE | Admit: 2022-07-05 | Discharge: 2022-07-05 | Disposition: A | Discharge status: Home / Self Care | Payer: 59 | Attending: Orthopaedic Surgery | Admitting: Orthopaedic Surgery

## 2022-07-05 ENCOUNTER — Other Ambulatory Visit: Payer: Self-pay

## 2022-07-05 ENCOUNTER — Ambulatory Visit (HOSPITAL_BASED_OUTPATIENT_CLINIC_OR_DEPARTMENT_OTHER): Payer: 59 | Admitting: Certified Registered"

## 2022-07-05 ENCOUNTER — Ambulatory Visit (HOSPITAL_COMMUNITY): Payer: 59

## 2022-07-05 ENCOUNTER — Encounter (HOSPITAL_BASED_OUTPATIENT_CLINIC_OR_DEPARTMENT_OTHER)
Admission: RE | Disposition: A | Discharge status: Home / Self Care | Payer: Self-pay | Source: Home / Self Care | Attending: Orthopaedic Surgery

## 2022-07-05 ENCOUNTER — Ambulatory Visit (HOSPITAL_BASED_OUTPATIENT_CLINIC_OR_DEPARTMENT_OTHER): Payer: 59

## 2022-07-05 DIAGNOSIS — W19XXXA Unspecified fall, initial encounter: Secondary | ICD-10-CM | POA: Insufficient documentation

## 2022-07-05 DIAGNOSIS — S52122A Displaced fracture of head of left radius, initial encounter for closed fracture: Secondary | ICD-10-CM | POA: Insufficient documentation

## 2022-07-05 DIAGNOSIS — S53422A Ulnohumeral (joint) sprain of left elbow, initial encounter: Secondary | ICD-10-CM | POA: Insufficient documentation

## 2022-07-05 DIAGNOSIS — F17210 Nicotine dependence, cigarettes, uncomplicated: Secondary | ICD-10-CM | POA: Diagnosis not present

## 2022-07-05 DIAGNOSIS — S46312A Strain of muscle, fascia and tendon of triceps, left arm, initial encounter: Secondary | ICD-10-CM | POA: Insufficient documentation

## 2022-07-05 DIAGNOSIS — J45909 Unspecified asthma, uncomplicated: Secondary | ICD-10-CM | POA: Insufficient documentation

## 2022-07-05 HISTORY — DX: Other specified postprocedural states: Z98.890

## 2022-07-05 HISTORY — PX: LIGAMENT REPAIR: SHX5444

## 2022-07-05 HISTORY — DX: Nausea with vomiting, unspecified: Z98.890

## 2022-07-05 HISTORY — DX: Nausea with vomiting, unspecified: R11.2

## 2022-07-05 HISTORY — PX: TRICEPS TENDON REPAIR: SHX2577

## 2022-07-05 HISTORY — PX: ORIF HUMERUS FRACTURE: SHX2126

## 2022-07-05 SURGERY — OPEN REDUCTION INTERNAL FIXATION (ORIF) DISTAL HUMERUS FRACTURE
Anesthesia: Regional | Site: Elbow | Laterality: Left

## 2022-07-05 MED ORDER — DEXAMETHASONE SODIUM PHOSPHATE 10 MG/ML IJ SOLN
INTRAMUSCULAR | Status: DC | PRN
  Administered 2022-07-05: 10 mg via INTRAVENOUS

## 2022-07-05 MED ORDER — PHENYLEPHRINE HCL (PRESSORS) 10 MG/ML IV SOLN
INTRAVENOUS | Status: AC
  Filled 2022-07-05: qty 1

## 2022-07-05 MED ORDER — CELECOXIB 100 MG PO CAPS: 100.0000 mg | ORAL_CAPSULE | Freq: Two times a day (BID) | ORAL | 0 refills | Status: AC

## 2022-07-05 MED ORDER — LACTATED RINGERS IV SOLN: INTRAVENOUS | Status: DC

## 2022-07-05 MED ORDER — FENTANYL CITRATE (PF) 100 MCG/2ML IJ SOLN
INTRAMUSCULAR | Status: AC
  Filled 2022-07-05: qty 2

## 2022-07-05 MED ORDER — LIDOCAINE 2% (20 MG/ML) 5 ML SYRINGE
INTRAMUSCULAR | Status: DC | PRN
  Administered 2022-07-05: 40 mg via INTRAVENOUS

## 2022-07-05 MED ORDER — OXYCODONE HCL 5 MG PO TABS: ORAL_TABLET | ORAL | 0 refills | Status: AC

## 2022-07-05 MED ORDER — IPRATROPIUM-ALBUTEROL 0.5-2.5 (3) MG/3ML IN SOLN
RESPIRATORY_TRACT | Status: AC
  Filled 2022-07-05: qty 3

## 2022-07-05 MED ORDER — PROPOFOL 10 MG/ML IV BOLUS
INTRAVENOUS | Status: AC
  Filled 2022-07-05: qty 20

## 2022-07-05 MED ORDER — BUPIVACAINE-EPINEPHRINE (PF) 0.5% -1:200000 IJ SOLN
INTRAMUSCULAR | Status: DC | PRN
  Administered 2022-07-05: 30 mL via PERINEURAL

## 2022-07-05 MED ORDER — SUGAMMADEX SODIUM 200 MG/2ML IV SOLN
INTRAVENOUS | Status: DC | PRN
  Administered 2022-07-05: 200 mg via INTRAVENOUS

## 2022-07-05 MED ORDER — ALBUTEROL SULFATE HFA 108 (90 BASE) MCG/ACT IN AERS
INHALATION_SPRAY | RESPIRATORY_TRACT | Status: DC | PRN
  Administered 2022-07-05: 8 via RESPIRATORY_TRACT

## 2022-07-05 MED ORDER — ONDANSETRON HCL 4 MG/2ML IJ SOLN
INTRAMUSCULAR | Status: AC
  Filled 2022-07-05: qty 2

## 2022-07-05 MED ORDER — KETOROLAC TROMETHAMINE 30 MG/ML IJ SOLN
30.0000 mg | Freq: Once | INTRAMUSCULAR | Status: AC | PRN
  Administered 2022-07-05: 30 mg via INTRAVENOUS

## 2022-07-05 MED ORDER — ONDANSETRON HCL 4 MG/2ML IJ SOLN
INTRAMUSCULAR | Status: DC | PRN
  Administered 2022-07-05: 4 mg via INTRAVENOUS

## 2022-07-05 MED ORDER — KETOROLAC TROMETHAMINE 30 MG/ML IJ SOLN
INTRAMUSCULAR | Status: AC
  Filled 2022-07-05: qty 1

## 2022-07-05 MED ORDER — ACETAMINOPHEN 500 MG PO TABS
1000.0000 mg | ORAL_TABLET | Freq: Once | ORAL | Status: AC
  Administered 2022-07-05: 1000 mg via ORAL

## 2022-07-05 MED ORDER — ACETAMINOPHEN 500 MG PO TABS: 1000.0000 mg | ORAL_TABLET | Freq: Three times a day (TID) | ORAL | 0 refills | Status: AC

## 2022-07-05 MED ORDER — ALBUTEROL SULFATE HFA 108 (90 BASE) MCG/ACT IN AERS
INHALATION_SPRAY | RESPIRATORY_TRACT | Status: AC
  Filled 2022-07-05: qty 6.7

## 2022-07-05 MED ORDER — IPRATROPIUM-ALBUTEROL 0.5-2.5 (3) MG/3ML IN SOLN
3.0000 mL | RESPIRATORY_TRACT | Status: DC
  Administered 2022-07-05: 3 mL via RESPIRATORY_TRACT

## 2022-07-05 MED ORDER — ROCURONIUM BROMIDE 10 MG/ML (PF) SYRINGE
PREFILLED_SYRINGE | INTRAVENOUS | Status: AC
  Filled 2022-07-05: qty 10

## 2022-07-05 MED ORDER — 0.9 % SODIUM CHLORIDE (POUR BTL) OPTIME
TOPICAL | Status: DC | PRN
  Administered 2022-07-05: 1000 mL

## 2022-07-05 MED ORDER — PROPOFOL 10 MG/ML IV BOLUS
INTRAVENOUS | Status: DC | PRN
  Administered 2022-07-05: 200 mg via INTRAVENOUS

## 2022-07-05 MED ORDER — FENTANYL CITRATE (PF) 100 MCG/2ML IJ SOLN
25.0000 ug | INTRAMUSCULAR | Status: DC | PRN
  Administered 2022-07-05 (×2): 25 ug via INTRAVENOUS

## 2022-07-05 MED ORDER — GABAPENTIN 100 MG PO CAPS: 100.0000 mg | ORAL_CAPSULE | Freq: Three times a day (TID) | ORAL | 0 refills | Status: AC

## 2022-07-05 MED ORDER — FENTANYL CITRATE (PF) 100 MCG/2ML IJ SOLN
INTRAMUSCULAR | Status: DC | PRN
  Administered 2022-07-05 (×2): 50 ug via INTRAVENOUS
  Administered 2022-07-05: 100 ug via INTRAVENOUS

## 2022-07-05 MED ORDER — ONDANSETRON HCL 4 MG PO TABS: 4.0000 mg | ORAL_TABLET | Freq: Three times a day (TID) | ORAL | 0 refills | Status: AC | PRN

## 2022-07-05 MED ORDER — GABAPENTIN 300 MG PO CAPS
300.0000 mg | ORAL_CAPSULE | Freq: Once | ORAL | Status: AC
  Administered 2022-07-05: 300 mg via ORAL

## 2022-07-05 MED ORDER — ACETAMINOPHEN 500 MG PO TABS
ORAL_TABLET | ORAL | Status: AC
  Filled 2022-07-05: qty 2

## 2022-07-05 MED ORDER — CEFAZOLIN SODIUM-DEXTROSE 2-4 GM/100ML-% IV SOLN
2.0000 g | INTRAVENOUS | Status: AC
  Administered 2022-07-05: 2 g via INTRAVENOUS

## 2022-07-05 MED ORDER — GABAPENTIN 300 MG PO CAPS
ORAL_CAPSULE | ORAL | Status: AC
  Filled 2022-07-05: qty 1

## 2022-07-05 MED ORDER — PROMETHAZINE HCL 25 MG/ML IJ SOLN
6.2500 mg | INTRAMUSCULAR | Status: DC | PRN
  Administered 2022-07-05: 12.5 mg via INTRAVENOUS

## 2022-07-05 MED ORDER — OXYCODONE HCL 5 MG/5ML PO SOLN: 5.0000 mg | Freq: Once | ORAL | Status: DC | PRN

## 2022-07-05 MED ORDER — OXYCODONE HCL 5 MG PO TABS: 5.0000 mg | ORAL_TABLET | Freq: Once | ORAL | Status: DC | PRN

## 2022-07-05 MED ORDER — AMISULPRIDE (ANTIEMETIC) 5 MG/2ML IV SOLN
10.0000 mg | Freq: Once | INTRAVENOUS | Status: AC | PRN
  Administered 2022-07-05: 10 mg via INTRAVENOUS

## 2022-07-05 MED ORDER — VANCOMYCIN HCL 1000 MG IV SOLR
INTRAVENOUS | Status: DC | PRN
  Administered 2022-07-05: 1000 mg via TOPICAL

## 2022-07-05 MED ORDER — MIDAZOLAM HCL 2 MG/2ML IJ SOLN
INTRAMUSCULAR | Status: AC
  Filled 2022-07-05: qty 2

## 2022-07-05 MED ORDER — AMISULPRIDE (ANTIEMETIC) 5 MG/2ML IV SOLN
INTRAVENOUS | Status: AC
  Filled 2022-07-05: qty 4

## 2022-07-05 MED ORDER — CEFAZOLIN SODIUM-DEXTROSE 2-4 GM/100ML-% IV SOLN
INTRAVENOUS | Status: AC
  Filled 2022-07-05: qty 100

## 2022-07-05 MED ORDER — ROCURONIUM BROMIDE 100 MG/10ML IV SOLN
INTRAVENOUS | Status: DC | PRN
  Administered 2022-07-05: 50 mg via INTRAVENOUS

## 2022-07-05 MED ORDER — PROMETHAZINE HCL 25 MG/ML IJ SOLN
INTRAMUSCULAR | Status: AC
  Filled 2022-07-05: qty 1

## 2022-07-05 MED ORDER — DEXAMETHASONE SODIUM PHOSPHATE 10 MG/ML IJ SOLN
INTRAMUSCULAR | Status: AC
  Filled 2022-07-05: qty 1

## 2022-07-05 MED ORDER — LIDOCAINE 2% (20 MG/ML) 5 ML SYRINGE
INTRAMUSCULAR | Status: AC
  Filled 2022-07-05: qty 5

## 2022-07-05 MED ORDER — MIDAZOLAM HCL 2 MG/2ML IJ SOLN
2.0000 mg | Freq: Once | INTRAMUSCULAR | Status: AC
  Administered 2022-07-05: 2 mg via INTRAVENOUS

## 2022-07-05 SURGICAL SUPPLY — 86 items
ANCH SUT KNTLS STRL SHLDR SYS (Anchor) ×2 IMPLANT
ANCHOR SUT QUATTRO KNTLS 4.5 (Anchor) ×1 IMPLANT
APL PRP STRL LF DISP 70% ISPRP (MISCELLANEOUS) ×2
BIT DRILL 2.5X2.75 QC CALB (BIT) ×1 IMPLANT
BIT DRILL CALIBRATED 2.7 (BIT) ×1 IMPLANT
BIT DRILL CANN 2.4 (BIT) ×2
BIT DRILL CANN MAX VPC 2.4 (BIT) ×1 IMPLANT
BLADE HEX COATED 2.75 (ELECTRODE) ×3 IMPLANT
BLADE SURG 10 STRL SS (BLADE) ×3 IMPLANT
BLADE SURG 15 STRL LF DISP TIS (BLADE) ×3 IMPLANT
BLADE SURG 15 STRL SS (BLADE) ×2
BNDG ELASTIC 4X5.8 VLCR STR LF (GAUZE/BANDAGES/DRESSINGS) ×3 IMPLANT
CHLORAPREP W/TINT 26 (MISCELLANEOUS) ×3 IMPLANT
CLSR STERI-STRIP ANTIMIC 1/2X4 (GAUZE/BANDAGES/DRESSINGS) ×3 IMPLANT
CUBES CANC 20CC CANCUBE1/4 (Bone Implant) ×2 IMPLANT
CUFF TOURN SGL QUICK 18X3 (MISCELLANEOUS) IMPLANT
CUFF TOURN SGL QUICK 24 (TOURNIQUET CUFF)
CUFF TRNQT CYL 24X4X16.5-23 (TOURNIQUET CUFF) IMPLANT
DRAPE EXTREMITY T 121X128X90 (DISPOSABLE) ×3 IMPLANT
DRAPE INCISE IOBAN 66X45 STRL (DRAPES) ×3 IMPLANT
DRAPE OEC MINIVIEW 54X84 (DRAPES) ×1 IMPLANT
DRAPE U-SHAPE 47X51 STRL (DRAPES) ×3 IMPLANT
ELECT REM PT RETURN 9FT ADLT (ELECTROSURGICAL) ×2
ELECTRODE REM PT RTRN 9FT ADLT (ELECTROSURGICAL) ×3 IMPLANT
GAUZE SPONGE 4X4 12PLY STRL (GAUZE/BANDAGES/DRESSINGS) ×3 IMPLANT
GAUZE XEROFORM 5X9 LF (GAUZE/BANDAGES/DRESSINGS) ×1 IMPLANT
GLOVE BIO SURGEON STRL SZ 6.5 (GLOVE) ×3 IMPLANT
GLOVE BIOGEL PI IND STRL 6.5 (GLOVE) ×3 IMPLANT
GLOVE BIOGEL PI IND STRL 8 (GLOVE) ×3 IMPLANT
GLOVE ECLIPSE 8.0 STRL XLNG CF (GLOVE) ×3 IMPLANT
GOWN STRL REUS W/ TWL LRG LVL3 (GOWN DISPOSABLE) ×6 IMPLANT
GOWN STRL REUS W/TWL LRG LVL3 (GOWN DISPOSABLE) ×4
GOWN STRL REUS W/TWL XL LVL3 (GOWN DISPOSABLE) ×3 IMPLANT
GRAFT BNE CUBE CANC 1 20 (Bone Implant) IMPLANT
K-WIRE COCR 1.1X105 (WIRE) ×6
K-WIRE FIXATION 2.0X6 (WIRE) ×4
KWIRE COCR 1.1X105 (WIRE) ×3 IMPLANT
KWIRE FIXATION 2.0X6 (WIRE) ×2 IMPLANT
NDL SUT 6 .5 CRC .975X.05 MAYO (NEEDLE) ×1 IMPLANT
NEEDLE MAYO TAPER (NEEDLE) ×2
NS IRRIG 1000ML POUR BTL (IV SOLUTION) ×3 IMPLANT
PACK ARTHROSCOPY DSU (CUSTOM PROCEDURE TRAY) ×3 IMPLANT
PACK BASIN DAY SURGERY FS (CUSTOM PROCEDURE TRAY) ×3 IMPLANT
PAD CAST 4YDX4 CTTN HI CHSV (CAST SUPPLIES) ×3 IMPLANT
PADDING CAST COTTON 4X4 STRL (CAST SUPPLIES) ×2
PENCIL SMOKE EVACUATOR (MISCELLANEOUS) ×3 IMPLANT
PLATE LOCK LT SM (Plate) ×1 IMPLANT
RETRIEVER SUT HEWSON (MISCELLANEOUS) IMPLANT
SCREW LOCK 3.5X22 DIST TIB (Screw) ×2 IMPLANT
SCREW LOCK 3.5X28 DIST TIB (Screw) ×1 IMPLANT
SCREW LOCK CORT STAR 3.5X18 (Screw) ×2 IMPLANT
SCREW MAX VPC 3.4X24MM (Screw) ×1 IMPLANT
SCREW NON LOCKING LP 3.5 14MM (Screw) ×1 IMPLANT
SHEET MEDIUM DRAPE 40X70 STRL (DRAPES) ×3 IMPLANT
SLEEVE SCD COMPRESS KNEE MED (STOCKING) ×3 IMPLANT
SLING ARM FOAM STRAP LRG (SOFTGOODS) ×3 IMPLANT
SPIKE FLUID TRANSFER (MISCELLANEOUS) IMPLANT
SPLINT PLASTER CAST FAST 5X30 (CAST SUPPLIES) ×30 IMPLANT
SPONGE T-LAP 18X18 ~~LOC~~+RFID (SPONGE) ×3 IMPLANT
STAPLER VISISTAT 35W (STAPLE) IMPLANT
SUCTION FRAZIER HANDLE 10FR (MISCELLANEOUS) ×2
SUCTION TUBE FRAZIER 10FR DISP (MISCELLANEOUS) ×1 IMPLANT
SUT BROADBAND TAPE 2PK 1.5 (SUTURE) ×1 IMPLANT
SUT ETHILON 2 0 FS 18 (SUTURE) ×4 IMPLANT
SUT FIBERWIRE #2 38 REV NDL BL (SUTURE) ×2
SUT FIBERWIRE #2 38 T-5 BLUE (SUTURE)
SUT FIBERWIRE #5 38 CONV NDL (SUTURE)
SUT MAXBRAID #2 CVD NDL (SUTURE) ×1 IMPLANT
SUT MNCRL AB 4-0 PS2 18 (SUTURE) ×3 IMPLANT
SUT VIC AB 0 CT1 27 (SUTURE) ×10
SUT VIC AB 0 CT1 27XBRD ANBCTR (SUTURE) ×7 IMPLANT
SUT VIC AB 1 CT1 27 (SUTURE)
SUT VIC AB 1 CT1 27XBRD ANBCTR (SUTURE) IMPLANT
SUT VIC AB 2-0 CT1 27 (SUTURE)
SUT VIC AB 2-0 CT1 TAPERPNT 27 (SUTURE) IMPLANT
SUT VIC AB 2-0 SH 27 (SUTURE) ×2
SUT VIC AB 2-0 SH 27XBRD (SUTURE) ×3 IMPLANT
SUT VIC AB 3-0 SH 27 (SUTURE) ×4
SUT VIC AB 3-0 SH 27X BRD (SUTURE) ×2 IMPLANT
SUTURE FIBERWR #2 38 T-5 BLUE (SUTURE) IMPLANT
SUTURE FIBERWR #5 38 CONV NDL (SUTURE) IMPLANT
SUTURE FIBERWR#2 38 REV NDL BL (SUTURE) ×1 IMPLANT
SYR BULB EAR ULCER 3OZ GRN STR (SYRINGE) ×3 IMPLANT
TOWEL GREEN STERILE FF (TOWEL DISPOSABLE) ×6 IMPLANT
TUBE SUCTION HIGH CAP CLEAR NV (SUCTIONS) ×3 IMPLANT
WASHER 3.5MM (Orthopedic Implant) ×1 IMPLANT

## 2022-07-05 NOTE — Anesthesia Preprocedure Evaluation (Addendum)
Anesthesia Evaluation  Patient identified by MRN, date of birth, ID band Patient awake    Reviewed: Allergy & Precautions, NPO status , Patient's Chart, lab work & pertinent test results  History of Anesthesia Complications (+) PONV and history of anesthetic complications  Airway Mallampati: II  TM Distance: >3 FB Neck ROM: Full    Dental  (+) Edentulous Upper, Missing, Poor Dentition, Chipped,    Pulmonary asthma , Current Smoker and Patient abstained from smoking.   Pulmonary exam normal        Cardiovascular negative cardio ROS Normal cardiovascular exam     Neuro/Psych negative neurological ROS  negative psych ROS   GI/Hepatic negative GI ROS, Neg liver ROS,,,  Endo/Other  negative endocrine ROS    Renal/GU negative Renal ROS     Musculoskeletal negative musculoskeletal ROS (+)    Abdominal   Peds  Hematology negative hematology ROS (+)   Anesthesia Other Findings left elbow radial head fracture, ligament tear, unicondylar elbow fracture  Reproductive/Obstetrics                             Anesthesia Physical Anesthesia Plan  ASA: 2  Anesthesia Plan: Regional and General   Post-op Pain Management:    Induction: Intravenous  PONV Risk Score and Plan: 3 and Ondansetron, Dexamethasone, Midazolam and Treatment may vary due to age or medical condition  Airway Management Planned: Oral ETT  Additional Equipment:   Intra-op Plan:   Post-operative Plan: Extubation in OR  Informed Consent: I have reviewed the patients History and Physical, chart, labs and discussed the procedure including the risks, benefits and alternatives for the proposed anesthesia with the patient or authorized representative who has indicated his/her understanding and acceptance.     Dental advisory given  Plan Discussed with: CRNA  Anesthesia Plan Comments:        Anesthesia Quick Evaluation

## 2022-07-05 NOTE — Interval H&P Note (Signed)
All questions answered, patient wants to proceed with procedure. ? ?

## 2022-07-05 NOTE — Progress Notes (Signed)
Assisted Dr. Ellender with left, supraclavicular, ultrasound guided block. Side rails up, monitors on throughout procedure. See vital signs in flow sheet. Tolerated Procedure well. 

## 2022-07-05 NOTE — Anesthesia Procedure Notes (Signed)
Procedure Name: Intubation Date/Time: 07/05/2022 8:56 AM  Performed by: Lavonia Dana, CRNAPre-anesthesia Checklist: Patient identified, Emergency Drugs available, Suction available and Patient being monitored Patient Re-evaluated:Patient Re-evaluated prior to induction Oxygen Delivery Method: Circle system utilized Preoxygenation: Pre-oxygenation with 100% oxygen Induction Type: IV induction Ventilation: Mask ventilation without difficulty and Oral airway inserted - appropriate to patient size Laryngoscope Size: Mac and 3 Grade View: Grade I Tube type: Oral Tube size: 7.0 mm Number of attempts: 1 Airway Equipment and Method: Stylet, Oral airway and Bite block Placement Confirmation: ETT inserted through vocal cords under direct vision, positive ETCO2 and breath sounds checked- equal and bilateral Secured at: 22 cm Tube secured with: Tape Dental Injury: Teeth and Oropharynx as per pre-operative assessment

## 2022-07-05 NOTE — Transfer of Care (Signed)
Immediate Anesthesia Transfer of Care Note  Patient: Selena Bonilla  Procedure(s) Performed: OPEN REDUCTION INTERNAL FIXATION (ORIF) DISTAL HUMERUS FRACTURE (Left: Elbow) TRICEPS TENDON REPAIR (Left: Elbow) LIGAMENT REPAIR (Left: Elbow)  Patient Location: PACU  Anesthesia Type:GA combined with regional for post-op pain  Level of Consciousness: drowsy  Airway & Oxygen Therapy: Patient Spontanous Breathing and Patient connected to face mask oxygen  Post-op Assessment: Report given to RN and Post -op Vital signs reviewed and stable  Post vital signs: Reviewed and stable  Last Vitals:  Vitals Value Taken Time  BP 167/96 07/05/22 1124  Temp    Pulse 108 07/05/22 1126  Resp 26 07/05/22 1126  SpO2 97 % 07/05/22 1126  Vitals shown include unvalidated device data.  Last Pain:  Vitals:   07/05/22 0655  TempSrc: Oral  PainSc: 4       Patients Stated Pain Goal: 3 (07/05/22 1610)  Complications: No notable events documented.

## 2022-07-05 NOTE — Anesthesia Procedure Notes (Signed)
Anesthesia Regional Block: Supraclavicular block   Pre-Anesthetic Checklist: , timeout performed,  Correct Patient, Correct Site, Correct Laterality,  Correct Procedure, Correct Position, site marked,  Risks and benefits discussed,  Surgical consent,  Pre-op evaluation,  At surgeon's request and post-op pain management  Laterality: Left  Prep: chloraprep       Needles:  Injection technique: Single-shot  Needle Type: Echogenic Stimulator Needle     Needle Length: 9cm  Needle Gauge: 21     Additional Needles:   Procedures:,,,, ultrasound used (permanent image in chart),,    Narrative:  Start time: 07/05/2022 7:40 AM End time: 07/05/2022 7:50 AM Injection made incrementally with aspirations every 5 mL.  Performed by: Personally  Anesthesiologist: Leonides Grills, MD  Additional Notes: Functioning IV was confirmed and monitors were applied.  A timeout was performed. Sterile prep, hand hygiene and sterile gloves were used. A 8mm 21ga Arrow echogenic stimulator needle was used. Negative aspiration and negative test dose prior to incremental administration of local anesthetic. The patient tolerated the procedure well.  Ultrasound guidance: relevent anatomy identified, needle position confirmed, local anesthetic spread visualized around nerve(s), vascular puncture avoided.  Image printed for medical record.

## 2022-07-05 NOTE — Op Note (Signed)
Orthopaedic Surgery Operative Note (CSN: [id number])  Selena Bonilla  [birth year] Date of Surgery: 07/05/2022   Diagnoses:  Left elbow fracture dislocation including radial head fracture, triceps tear, bicondylar distal humerus fracture with multiple interfragmentary few pieces and lateral ligament disruption  Procedure: Left elbow open reduction internal fixation of bicondylar distal humerus fracture with intercondylar split- Modifier 22 Left elbow open treatment elbow dislocation Left distal triceps repair Left lateral ligament repair   Operative Finding Successful completion of the planned procedure.  Patient's case was extremely difficult.  Her CT scan did not adequately demonstrate the bone loss that she had as well as the significant complex nature of her fracture.  There was an intercalary piece of the trochlea which essentially had been delivered 6 to 7 cm proximal in the anterior arm.  This was removed.  Her lateral ligaments were completely avulsed as was the lateral column of the humerus.  The capitellum was a separate fragment with a intercalary piece as well.  There was significant lateral bone loss in the lateral column.  The elbow was also dislocated upon examination in the joint.  The radial head was injured with a complex fracture however the fragments himself still had a periosteal sleeve attached and we did not feel that fixing them was going to bring the patient any benefit.  Unfortunately the elbow as it dislocated lightly ruptured the medial aspect of the triceps was completely avulsed.  The lateral aspect of the triceps was still intact.  This is an extremely complicated case and based on this we billed a modifier 22.  The fragments of the medial trochlea were so comminuted that they were too small for screw fixation and I had to bury K wires in order to obtain purchase.  The remaining capitellar shear fragment was so small that I had to take screws to but not through the  subchondral bone.  I was able to palpate and feel that the screw did not penetrate into the articular surface.  Patient's risk of complication is quite high including but not limited to stiffness, instability, hardware perforation of the joint needing and necessitating hardware removal.  Will be almost impossible to get her K wires out unless they were able to be accessed from the medial side and would damage the medial side significantly to get them out.  The radial nerve was identified and was protected throughout the case.  Post-operative plan: The patient will be nonweightbearing in a splint for a week transferred either to a removable splint or a hinged brace locked at about 70 to 90 degrees for another week with attempts to start mid arc range of motion at that point with therapy.  We will avoid terminal extension for at least the first 3 to 4 weeks..  The patient will be discharged home.  DVT prophylaxis not indicated in this ambulatory upper extremity patient without significant risk factors.   Pain control with PRN pain medication preferring oral medicines.  Follow up plan will be scheduled in approximately 7 days for incision check and XR.  Patient will need a suture removal likely at 3 to 4 weeks as her skin was quite traumatized.  Post-Op Diagnosis: Same Surgeons:Primary: Bjorn Pippin, MD Assistants:Caroline McBane PA-C Location: [facility] OR ROOM 6 Anesthesia: General with regional anesthesia Antibiotics: Ancef 2 g with local vancomycin powder 1 g at the surgical site Tourniquet time:  Total Tourniquet Time Documented: Upper Arm (Left) - 104 minutes Total: Upper Arm (Left) - 104 minutes  Estimated Blood Loss: Minimal Complications: None Specimens: None Implants: Implant Name Type Inv. Item Serial No. Manufacturer Lot No. LRB No. Used Action  CUBES CANC 20CC CANCUBE1/4 - [id number] Bone Implant CUBES [facility] 20CC CANCUBE1/4 [id number] [facility] [id number] Left 1 Implanted   ANCHOR SUT QUATTRO KNTLS 4.5 - [id number] Anchor ANCHOR SUT QUATTRO KNTLS 4.5  ZIMMER RECON(ORTH,TRAU,BIO,SG) [id number] Left 1 Implanted  SCREW MAX VPC 3.4X24MM - [id number] Screw SCREW MAX VPC 3.4X24MM  ZIMMER RECON(ORTH,TRAU,BIO,SG) ON STERILE TRAY Left 1 Implanted  PLATE LOCK LT SM - [id number] Plate PLATE LOCK LT SM  ZIMMER RECON(ORTH,TRAU,BIO,SG) ON STERILE TRAY Left 1 Implanted  SCREW LOCK 3.5X22 DIST TIB - [id number] Screw SCREW LOCK 3.5X22 DIST TIB  ZIMMER RECON(ORTH,TRAU,BIO,SG) ON STERILE TRAY Left 2 Implanted  SCREW LOCK 3.5X28 DIST TIB - [id number] Screw SCREW LOCK 3.5X28 DIST TIB  ZIMMER RECON(ORTH,TRAU,BIO,SG) ON STERILE Left 1 Implanted  3.5 LP locking screw 18 mm     ON STERILE TRAY Left 2 Implanted  SCREW NON LOCKING LP 3.5 14MM - [id number] Screw SCREW NON LOCKING LP 3.5 14MM  ZIMMER RECON(ORTH,TRAU,BIO,SG) ON STERILE TRAY Left 1 Implanted  WASHER 3.5MM - [id number] Orthopedic Implant WASHER 3.5MM  ZIMMER RECON(ORTH,TRAU,BIO,SG) ON STERILE TRAY Left 1 Implanted    Indications for Surgery:   Selena Bonilla is a 56 y.o. female with fall resulting in a complex elbow fracture with significant displacement and instability.  Benefits and risks of operative and nonoperative management were discussed prior to surgery with patient/guardian(s) and informed consent form was completed.  Specific risks including infection, need for additional surgery, nonunion, malunion, loss of fixation, hardware prominence, arthrosis, heterotopic bone and neurovascular damage amongst others.   Procedure:   The patient was identified properly. Informed consent was obtained and the surgical site was marked. The patient was taken up to suite where general anesthesia was induced.  The patient was positioned lateral on a beanbag with arm over Shure foot positioner.  The left elbow was prepped and draped in the usual sterile fashion.  Timeout was performed before the beginning of the case.  Tourniquet was used for  the above duration.  We began with a posterior lateral incision.  Went through skin sharply treatment hemostasis we progressed.  We identified a window between Kocher's interval and the direct posterior approach.  We elevated full-thickness skin flaps as the skin was quite traumatized.  We went slightly medial and identified that the medial aspect of the triceps was avulsed and there was a bony avulsion fragment.  We felt that this would need to be repaired.  We did not do this yet and we turned our attention back to Kocher's interval.  We went to the lateral aspect of her incision and identified Kocher's interval.  We pronated the hand to avoid the PIN being in our field.  We opened Kocher's interval and noted a significant hematoma.  That point we were able to identify that there is an extremely complex fracture with complete avulsion of the lateral ulnar collateral ligament.  After elevating the capsule from the anterior lateral humerus that had not already avulsed we were able to actually remove multiple large fragments of bone including the entirety of the capitellum and at least 50% of the trochlea.  The capitellum fragment had some bone, about 15 to 20 mm attached however the trochlear fragment only had a few millimeters of bone and was a spiral type fragment that had a large amount of the articular surface however very little bone  to fix.  We took the multiple fragments to the back table and started to assemble them using a series of K wires.  It was clear that there would be no way to place screws into the trochlear fragment as it was far too small for this.  We instead felt that bearing K wires would provide reasonable fixation and avoid leaving a large uncovered area of the trochlea spool.  We irrigated copiously and identified that the radial head had a small fragment involving about 15 to 20% of the head however it had a periosteal sleeve still around it and we did not feel that a screw would help  as it was relatively comminuted.  I thought that leaving it alone and treating it closed would be appropriate.  The elbow was dislocated upon examination and remove these loose fragments and reduce the elbow.  This was an open treatment of elbow dislocation.  Once we had taken stock in all of the injuries we delivered the elbow out the lateral aspect taking the distal fragment medially.  This allowed exposure to the spool of the trochlea without disrupting the remaining extensor mechanism and performing an osteotomy which would not of been possible in the typical sent secondary to a partial triceps rupture.  We then used 1.2 mm K wires to hold the trochlear spool fragment together which involve the medial column of the distal humerus.  The far medial column appear to be intact.  We placed these wires through the medial cortex while protecting the ulnar nerve.  Once they were through we then withdrew them somewhat cut them flush and then tamped them down to ensure that they were not prominent and still buried.  We then were able to assemble the remainder of the joint noted significant bone loss.  We used a series of K wires to provisionally fix this.  Once we had the capitellum provisionally fixed we used a posterior lateral plate from the Biomet Alps system to reconstruct it.  We used 3 multidirectional locking screws distally and multiple locking screws proximally to hold the capitellar fragment.  We bone grafted with allograft bone to fill the bone defect.  There was a small intercalary cartilage piece that was able to be held with a single medium sized Biomet headless screw.  The reduction of the joint was anatomic.  We are able to reduce the joint at that point and taken through range of motion ensuring that it was clearly well reduced and that through direct visualization none of the screws with perforated the articular surface.  We used fluoroscopy to place her hardware however I was very careful to  ensure that each of the screws was measured to the articular cartilage and then 2 mm was subtracted.  This meant that our x-rays noted perforation of screws however this was into the cartilage surfaces.  As the fragments were quite small and needed subchondral purchase in order to obtain any good fixation.  I felt that this was appropriate considering the complex nature of the fracture rather than leaving the screw shorter.  This did increase the risk of screw perforation if there was settling of the fracture however without this I felt that the fracture would likely fall apart.  We did call in the middle of the case to confirm with the family that allograft bone was not against any religious beliefs as I had not initially thought that this would be necessary.  The family verified that they would like to  proceed with this.  Once the capitellum and the articular surface placed back together we noted there was a large fragment of bone that was avulsed with a lateral ulnar collateral ligament.  We placed this back in place by using a series of nonabsorbable number two 5 FiberWire and max braid suture which were sewn into the common extensors, lateral ulnar collateral ligament and tied to the plate.  This reduced the joint.  We then closed the lateral Kocher interval using 0 Vicryl.  We turned our attention to the distal triceps.  We cleared the hematoma from the fracture site and used a #2 Biomet suture tape which was whipstitched in a running running locking [place] fashion.  We then drilled and placed a Quatro link anchor in the fracture bed and were able to do a knotless repair of the distal triceps of which about 50% was ruptured.   Final x-rays demonstrate anatomic reduction of the joint and a stable reduction of the joint.   We irrigated the wound copiously before placing local antibiotic as listed above.  We closed the incision in a multilayer fashion with nonabsorbable suture.  Sterile dressing was  placed.  Well molded well-padded posterior slab splint was placed and allowed to harden before the patient was awoken.  Patient was awoken taken to PACU in stable condition.  Alfonse Alpers, PA-C, present and scrubbed throughout the case, critical for completion in a timely fashion, and for retraction, instrumentation, closure.

## 2022-07-05 NOTE — Progress Notes (Signed)
Spoke to Dr, Mal Amabile about O2 sat of 92-93% for 10-15 min consistently on room air. Dr. Mal Amabile agreed patient could be discharged

## 2022-07-06 ENCOUNTER — Encounter (HOSPITAL_BASED_OUTPATIENT_CLINIC_OR_DEPARTMENT_OTHER): Payer: Self-pay | Admitting: Orthopaedic Surgery

## 2022-07-06 NOTE — Anesthesia Postprocedure Evaluation (Signed)
Anesthesia Post Note  Patient: Selena Bonilla  Procedure(s) Performed: OPEN REDUCTION INTERNAL FIXATION (ORIF) DISTAL HUMERUS FRACTURE (Left: Elbow) TRICEPS TENDON REPAIR (Left: Elbow) LIGAMENT REPAIR (Left: Elbow)     Patient location during evaluation: PACU Anesthesia Type: Regional and General Level of consciousness: awake Pain management: pain level controlled Vital Signs Assessment: post-procedure vital signs reviewed and stable Respiratory status: spontaneous breathing, nonlabored ventilation and respiratory function stable Cardiovascular status: blood pressure returned to baseline and stable Postop Assessment: no apparent nausea or vomiting Anesthetic complications: no   No notable events documented.  Last Vitals:  Vitals:   07/05/22 1345 07/05/22 1444  BP:  (!) 111/58  Pulse: 90 76  Resp: (!) 21 20  Temp:  (!) 36.4 C  SpO2: 96% 92%    Last Pain:  Vitals:   07/05/22 1444  TempSrc: Oral  PainSc: 0-No pain                 Mattheu Brodersen P Mance Vallejo

## 2022-09-08 ENCOUNTER — Encounter: Payer: Self-pay | Admitting: Radiology

## 2023-05-25 ENCOUNTER — Other Ambulatory Visit: Payer: Self-pay | Admitting: Internal Medicine

## 2023-05-25 DIAGNOSIS — Z1231 Encounter for screening mammogram for malignant neoplasm of breast: Secondary | ICD-10-CM

## 2023-06-14 ENCOUNTER — Ambulatory Visit
Admission: RE | Admit: 2023-06-14 | Discharge: 2023-06-14 | Disposition: A | Discharge status: Home / Self Care | Payer: 59 | Source: Ambulatory Visit | Attending: Family Medicine | Admitting: Family Medicine

## 2023-06-14 DIAGNOSIS — Z1231 Encounter for screening mammogram for malignant neoplasm of breast: Secondary | ICD-10-CM

## 2023-08-14 ENCOUNTER — Encounter (INDEPENDENT_AMBULATORY_CARE_PROVIDER_SITE_OTHER): Payer: Self-pay | Admitting: *Deleted

## 2023-09-13 ENCOUNTER — Ambulatory Visit (INDEPENDENT_AMBULATORY_CARE_PROVIDER_SITE_OTHER): Payer: 59 | Admitting: Gastroenterology

## 2023-09-13 ENCOUNTER — Encounter (INDEPENDENT_AMBULATORY_CARE_PROVIDER_SITE_OTHER): Payer: Self-pay | Admitting: Gastroenterology

## 2023-09-13 VITALS — BP 115/74 | HR 83 | Temp 97.4°F | Ht 71.0 in | Wt 212.0 lb

## 2023-09-13 DIAGNOSIS — Z860101 Personal history of adenomatous and serrated colon polyps: Secondary | ICD-10-CM

## 2023-09-13 DIAGNOSIS — R748 Abnormal levels of other serum enzymes: Secondary | ICD-10-CM | POA: Diagnosis not present

## 2023-09-13 DIAGNOSIS — Z860100 Personal history of colon polyps, unspecified: Secondary | ICD-10-CM

## 2023-09-13 MED ORDER — SUTAB 1479-225-188 MG PO TABS: ORAL_TABLET | ORAL | 0 refills | Status: DC

## 2023-09-13 NOTE — Patient Instructions (Signed)
 It was very nice to meet you today, as dicussed with will plan for the following :  1) Colonoscopy  2) Labwork and Ultrasound

## 2023-09-13 NOTE — Progress Notes (Signed)
 Vista Lawman , M.D. Gastroenterology & Hepatology [facility] [address] [place], [place] [address] Primary Care Physician: Kirstie Peri, MD [address] [place] [address]  Chief Complaint: Colon polyps requiring removal, elevated ALP  History of Present Illness: Selena HOGENSON is a 58 y.o. female with no significant comorbidities who presents for evaluation for colonoscopy and elevated liver enzymes  Patient has no active GI complaints.  Patient underwent colonoscopy around 2 months ago by Dr. Blima Dessert and found to have multiple polyps and one of them sessile serrated polyp.  Reports that there was large polypoid lesions in the sigmoid colon which were not removed. The patient denies having any nausea, vomiting, fever, chills, hematochezia, melena, hematemesis, abdominal distention, abdominal pain, diarrhea, jaundice, pruritus or weight loss.  Last GEX:BMWU Last Colonoscopy:07/2023 By Dr. Aurea Graff   Findings: A digital rectal exam was performed. It was normal. Multiple large-mouthed, medium-mouthed and small-mouthed diverticula were found in the sigmoid colon, descending colon and transverse colon. A 5 mm polyp was found at 90 cm proximal to the anus. The polyp was sessile. The polyp was removed with a cold snare. Resection was complete, but the polyp tissue was only partially retrieved. A 3 mm polyp was found at 60 cm proximal to the anus. The polyp was sessile. The polyp was removed with a cold biopsy forceps. Resection and retrieval were complete. A multiple small and large polypoid lesions were found in the sigmoid colon and in the descending colon. They were semi-pedunculated. It is possible that they were hyperplastic but were also located in an area with many diverticula. Recommend GI evaluation and management.  Impression: - Preparation of the colon was fair. A digital rectal exam was performed. It was normal. -  Diverticulosis in the sigmoid colon, in the descending colon and in the transverse colon. - One 5 mm polyp at 90 cm proximal to the anus, removed with a cold snare. Complete resection. Partial retrieval. - One 3 mm polyp at 60 cm proximal to the anus, removed with a cold biopsy forceps. Resected and retrieved. - Polypoid lesions in the sigmoid colon and in the descending colon two large and several smaller ones. Recommend GI evaluation and management Estimated Blood Loss: Estimated blood loss was minimal.  Recommendation: - Patient has a contact number available for emergencies. The signs and symptoms of potential delayed complications were discussed with the patient. Return to normal activities tomorrow. Written discharge instructions were provided to the patient. - Resume previous diet today. - Await pathology results. - Repeat colonoscopy with gastroenterology.  Path : Specimen 1: Colon, biopsy, cold snare and radial jaw   -  SESSILE SERRATED POLYP, ONE FRAGMENT.NEGATIVE FOR DYSPLASIA OR MALIGNANCY.   Specimen 2: Colon, polyp(s), polyp at 60 cm radial jaw   -  HYPERPLASTIC POLYP, ONE FRAGMENT.NEGATIVE FOR DYSPLASIA OR MALIGNANCY  FHx: neg for any gastrointestinal/liver disease, no malignancies Social: neg smoking, alcohol or illicit drug use Surgical: Hernia repair  Past Medical History: Past Medical History:  Diagnosis Date   Asthma    Hypothyroid    PONV (postoperative nausea and vomiting)     Past Surgical History: Past Surgical History:  Procedure Laterality Date   CESAREAN SECTION     3 c sections   CHOLECYSTECTOMY     HERNIA REPAIR     LIGAMENT REPAIR Left 07/05/2022   Procedure: LIGAMENT REPAIR;  Surgeon: Bjorn Pippin, MD;  Location: [facility];  Service: Orthopedics;  Laterality: Left;   ORIF HUMERUS FRACTURE Left 07/05/2022   Procedure: OPEN REDUCTION INTERNAL FIXATION (ORIF) DISTAL HUMERUS FRACTURE;  Surgeon: Bjorn Pippin, MD;   Location: [facility];  Service: Orthopedics;  Laterality: Left;   TRICEPS TENDON REPAIR Left 07/05/2022   Procedure: TRICEPS TENDON REPAIR;  Surgeon: Bjorn Pippin, MD;  Location: [facility];  Service: Orthopedics;  Laterality: Left;    Family History: Family History  Problem Relation Age of Onset   Breast cancer Neg Hx     Social History: Social History   Tobacco Use  Smoking Status Every Day   Current packs/day: 0.50   Types: Cigarettes   Passive exposure: Current  Smokeless Tobacco Never   Social History   Substance and Sexual Activity  Alcohol Use Yes   Comment: occasional   Social History   Substance and Sexual Activity  Drug Use Never    Allergies: No Known Allergies  Medications: Current Outpatient Medications  Medication Sig Dispense Refill   albuterol (PROVENTIL HFA;VENTOLIN HFA) 108 (90 Base) MCG/ACT inhaler Inhale into the lungs every 6 (six) hours as needed for wheezing or shortness of breath.     buPROPion (WELLBUTRIN XL) 150 MG 24 hr tablet Take 150 mg by mouth daily.     levothyroxine (SYNTHROID) 25 MCG tablet Take 25 mcg by mouth daily before breakfast.     No current facility-administered medications for this visit.    Review of Systems: GENERAL: negative for malaise, night sweats HEENT: No changes in hearing or vision, no nose bleeds or other nasal problems. NECK: Negative for lumps, goiter, pain and significant neck swelling RESPIRATORY: Negative for cough, wheezing CARDIOVASCULAR: Negative for chest pain, leg swelling, palpitations, orthopnea GI: SEE HPI MUSCULOSKELETAL: Negative for joint pain or swelling, back pain, and muscle pain. SKIN: Negative for lesions, rash HEMATOLOGY Negative for prolonged bleeding, bruising easily, and swollen nodes. ENDOCRINE: Negative for cold or heat intolerance, polyuria, polydipsia and goiter. NEURO: negative for tremor, gait imbalance, syncope and seizures. The remainder of  the review of systems is noncontributory.   Physical Exam: BP 115/74   Pulse 83   Temp (!) 97.4 F (36.3 C)   Ht 5\' 11"  (1.803 m)   Wt 212 lb (96.2 kg)   BMI 29.57 kg/m   GENERAL: The patient is AO x3, in no acute distress. HEENT: Head is normocephalic and atraumatic. EOMI are intact. Mouth is well hydrated and without lesions. NECK: Supple. No masses LUNGS: Clear to auscultation. No presence of rhonchi/wheezing/rales. Adequate chest expansion HEART: RRR, normal s1 and s2. ABDOMEN: Soft, nontender, no guarding, no peritoneal signs, and nondistended. BS +. No masses.  Imaging/Labs: as above      No data to display         No results found for: "IRON", "TIBC", "FERRITIN"  I personally reviewed and interpreted the available labs, imaging and endoscopic files.  Impression and Plan:  Selena Bonilla is a 58 y.o. female with no significant comorbidities who presents for evaluation for colonoscopy and elevated liver enzymes  #Colon polyps #Sessile serrated polyps   Patient underwent colonoscopy around 2 months ago by Dr. Blima Dessert and found to have multiple polyps and one of them sessile serrated polyp.  Per report that there was large polypoid lesions in the sigmoid colon which were not removed.  Given patient had sessile serrated polyp this may be a large sessile serrated polyp and considered precancerous lesion ;  removal is recommended to prevent colon cancer  Plan for repeat colonoscopy with removal of remaining polyps +/- EMR  #Elevated ALP  Reviewing patient's previous labs she appears to have chronically elevated ALP  Will evaluate etiology and origin of ALP by obtaining isoenzyme Will obtain baseline viral hepatitis profile, AMA and abdominal ultrasound  Thank you Dr.Straughan for this referral   All questions were answered.      Vista Lawman, MD Gastroenterology and Hepatology [facility]enter[facility]   This chart has been  completed using [facility] Dictation software, and while attempts have been made to ensure accuracy , certain words and phrases may not be transcribed as intended

## 2023-09-17 LAB — HEPATITIS A ANTIBODY, TOTAL: hep A Total Ab: NEGATIVE

## 2023-09-17 LAB — HEPATITIS C ANTIBODY: Hep C Virus Ab: NONREACTIVE

## 2023-09-17 LAB — ALKALINE PHOSPHATASE, ISOENZYMES
Alkaline Phosphatase: 147 IU/L — ABNORMAL HIGH (ref 44–121)
BONE FRACTION: 50 % (ref 14–68)
INTESTINAL FRAC.: 1 % (ref 0–18)
LIVER FRACTION: 49 % (ref 18–85)

## 2023-09-17 LAB — MITOCHONDRIAL ANTIBODIES: Mitochondrial Ab: <20 U (ref 0.0–20.0)

## 2023-09-17 LAB — HEPATITIS B SURFACE ANTIBODY,QUALITATIVE: Hep B Surface Ab, Qual: NONREACTIVE

## 2023-09-17 LAB — HEPATITIS B CORE ANTIBODY, TOTAL: Hep B Core Total Ab: NEGATIVE

## 2023-09-17 LAB — HEPATITIS B SURFACE ANTIGEN: Hepatitis B Surface Ag: NEGATIVE

## 2023-09-19 ENCOUNTER — Encounter (INDEPENDENT_AMBULATORY_CARE_PROVIDER_SITE_OTHER): Payer: Self-pay | Admitting: Gastroenterology

## 2023-09-19 NOTE — Progress Notes (Signed)
 Labs for elevated ALP :  ALP: 147 , normal bone fraction (49%) , negative AMA, Hep A non immune , Hep B non exposed and non immune,  Hep C negative

## 2023-09-20 ENCOUNTER — Ambulatory Visit (HOSPITAL_COMMUNITY)
Admission: RE | Admit: 2023-09-20 | Discharge: 2023-09-20 | Disposition: A | Discharge status: Home / Self Care | Source: Ambulatory Visit | Attending: Gastroenterology | Admitting: Gastroenterology

## 2023-09-20 DIAGNOSIS — R748 Abnormal levels of other serum enzymes: Secondary | ICD-10-CM | POA: Insufficient documentation

## 2023-10-02 ENCOUNTER — Encounter (INDEPENDENT_AMBULATORY_CARE_PROVIDER_SITE_OTHER): Payer: Self-pay | Admitting: Gastroenterology

## 2023-10-02 NOTE — Progress Notes (Signed)
 US Abdomen Complete  IMPRESSION: 1. Mild splenomegaly. 2. Mildly heterogeneous hepatic parenchymal echogenicity measuring the upper limits of normal. This is nonspecific but can be seen in the setting of hepatic steatosis.

## 2023-10-18 ENCOUNTER — Other Ambulatory Visit: Payer: Self-pay

## 2023-10-18 ENCOUNTER — Ambulatory Visit (HOSPITAL_COMMUNITY)
Admission: RE | Admit: 2023-10-18 | Discharge: 2023-10-18 | Disposition: A | Discharge status: Home / Self Care | Source: Ambulatory Visit | Attending: Gastroenterology | Admitting: Gastroenterology

## 2023-10-18 ENCOUNTER — Ambulatory Visit (HOSPITAL_BASED_OUTPATIENT_CLINIC_OR_DEPARTMENT_OTHER): Admitting: Anesthesiology

## 2023-10-18 ENCOUNTER — Ambulatory Visit (HOSPITAL_COMMUNITY): Admitting: Anesthesiology

## 2023-10-18 ENCOUNTER — Encounter (HOSPITAL_COMMUNITY)
Admission: RE | Disposition: A | Discharge status: Home / Self Care | Payer: Self-pay | Source: Ambulatory Visit | Attending: Gastroenterology

## 2023-10-18 ENCOUNTER — Encounter (HOSPITAL_COMMUNITY): Payer: Self-pay | Admitting: Gastroenterology

## 2023-10-18 ENCOUNTER — Encounter (INDEPENDENT_AMBULATORY_CARE_PROVIDER_SITE_OTHER): Payer: Self-pay | Admitting: *Deleted

## 2023-10-18 DIAGNOSIS — F17200 Nicotine dependence, unspecified, uncomplicated: Secondary | ICD-10-CM | POA: Diagnosis not present

## 2023-10-18 DIAGNOSIS — K648 Other hemorrhoids: Secondary | ICD-10-CM | POA: Diagnosis not present

## 2023-10-18 DIAGNOSIS — K623 Rectal prolapse: Secondary | ICD-10-CM

## 2023-10-18 DIAGNOSIS — Z1211 Encounter for screening for malignant neoplasm of colon: Secondary | ICD-10-CM

## 2023-10-18 DIAGNOSIS — K649 Unspecified hemorrhoids: Secondary | ICD-10-CM

## 2023-10-18 DIAGNOSIS — D123 Benign neoplasm of transverse colon: Secondary | ICD-10-CM | POA: Insufficient documentation

## 2023-10-18 DIAGNOSIS — K635 Polyp of colon: Secondary | ICD-10-CM

## 2023-10-18 DIAGNOSIS — K5730 Diverticulosis of large intestine without perforation or abscess without bleeding: Secondary | ICD-10-CM | POA: Diagnosis not present

## 2023-10-18 DIAGNOSIS — Z09 Encounter for follow-up examination after completed treatment for conditions other than malignant neoplasm: Secondary | ICD-10-CM | POA: Insufficient documentation

## 2023-10-18 DIAGNOSIS — K621 Rectal polyp: Secondary | ICD-10-CM | POA: Diagnosis not present

## 2023-10-18 DIAGNOSIS — J45909 Unspecified asthma, uncomplicated: Secondary | ICD-10-CM | POA: Insufficient documentation

## 2023-10-18 DIAGNOSIS — D125 Benign neoplasm of sigmoid colon: Secondary | ICD-10-CM

## 2023-10-18 DIAGNOSIS — I10 Essential (primary) hypertension: Secondary | ICD-10-CM | POA: Insufficient documentation

## 2023-10-18 DIAGNOSIS — K634 Enteroptosis: Secondary | ICD-10-CM | POA: Diagnosis not present

## 2023-10-18 DIAGNOSIS — Z860100 Personal history of colon polyps, unspecified: Secondary | ICD-10-CM

## 2023-10-18 HISTORY — PX: COLONOSCOPY: SHX5424

## 2023-10-18 LAB — HM COLONOSCOPY

## 2023-10-18 SURGERY — COLONOSCOPY
Anesthesia: General

## 2023-10-18 MED ORDER — STERILE WATER FOR IRRIGATION IR SOLN
Status: DC | PRN
  Administered 2023-10-18: 60 mL

## 2023-10-18 MED ORDER — PROPOFOL 10 MG/ML IV BOLUS
INTRAVENOUS | Status: DC | PRN
  Administered 2023-10-18: 50 mg via INTRAVENOUS

## 2023-10-18 MED ORDER — PROPOFOL 500 MG/50ML IV EMUL
INTRAVENOUS | Status: DC | PRN
  Administered 2023-10-18: 125 ug/kg/min via INTRAVENOUS

## 2023-10-18 MED ORDER — LACTATED RINGERS IV SOLN: INTRAVENOUS | Status: DC | PRN

## 2023-10-18 MED ORDER — LACTATED RINGERS IV SOLN
INTRAVENOUS | Status: DC
  Administered 2023-10-18: 500 mL via INTRAVENOUS

## 2023-10-18 NOTE — Anesthesia Preprocedure Evaluation (Signed)
 Anesthesia Evaluation  Patient identified by MRN, date of birth, ID band Patient awake    Reviewed: Allergy & Precautions, H&P , NPO status , Patient's Chart, lab work & pertinent test results, reviewed documented beta blocker date and time   History of Anesthesia Complications (+) PONV and history of anesthetic complications  Airway Mallampati: II  TM Distance: >3 FB Neck ROM: full    Dental no notable dental hx.    Pulmonary neg pulmonary ROS, asthma , Current Smoker   Pulmonary exam normal breath sounds clear to auscultation       Cardiovascular Exercise Tolerance: Good hypertension, negative cardio ROS  Rhythm:regular Rate:Normal     Neuro/Psych negative neurological ROS  negative psych ROS   GI/Hepatic negative GI ROS, Neg liver ROS,,,  Endo/Other  negative endocrine ROSHypothyroidism    Renal/GU negative Renal ROS  negative genitourinary   Musculoskeletal   Abdominal   Peds  Hematology negative hematology ROS (+)   Anesthesia Other Findings   Reproductive/Obstetrics negative OB ROS                             Anesthesia Physical Anesthesia Plan  ASA: 2  Anesthesia Plan: General   Post-op Pain Management:    Induction:   PONV Risk Score and Plan: Propofol infusion  Airway Management Planned:   Additional Equipment:   Intra-op Plan:   Post-operative Plan:   Informed Consent: I have reviewed the patients History and Physical, chart, labs and discussed the procedure including the risks, benefits and alternatives for the proposed anesthesia with the patient or authorized representative who has indicated his/her understanding and acceptance.     Dental Advisory Given  Plan Discussed with: CRNA  Anesthesia Plan Comments:        Anesthesia Quick Evaluation

## 2023-10-18 NOTE — Discharge Instructions (Signed)
  Discharge instructions Please read the instructions outlined below and refer to this sheet in the next few weeks. These discharge instructions provide you with general information on caring for yourself after you leave the hospital. Your doctor may also give you specific instructions. While your treatment has been planned according to the most current medical practices available, unavoidable complications occasionally occur. If you have any problems or questions after discharge, please call your doctor. ACTIVITY You may resume your regular activity but move at a slower pace for the next 24 hours.  Take frequent rest periods for the next 24 hours.  Walking will help expel (get rid of) the air and reduce the bloated feeling in your abdomen.  No driving for 24 hours (because of the anesthesia (medicine) used during the test).  You may shower.  Do not sign any important legal documents or operate any machinery for 24 hours (because of the anesthesia used during the test).  NUTRITION Drink plenty of fluids.  You may resume your normal diet.  Begin with a light meal and progress to your normal diet.  Avoid alcoholic beverages for 24 hours or as instructed by your caregiver.  MEDICATIONS You may resume your normal medications unless your caregiver tells you otherwise.  WHAT YOU CAN EXPECT TODAY You may experience abdominal discomfort such as a feeling of fullness or "gas" pains.  FOLLOW-UP Your doctor will discuss the results of your test with you.  SEEK IMMEDIATE MEDICAL ATTENTION IF ANY OF THE FOLLOWING OCCUR: Excessive nausea (feeling sick to your stomach) and/or vomiting.  Severe abdominal pain and distention (swelling).  Trouble swallowing.  Temperature over 101 F (37.8 C).  Rectal bleeding or vomiting of blood.     Avoid using high dose aspirin including Goody/BC powders, NSAIDs such as Aleve, ibuprofen, naproxen, Motrin, Voltaren or Advil (even the topical ones)for 14 days atleast    I hope you have a great rest of your week!   Vista Lawman , M.D.. Gastroenterology and Hepatology [facility]

## 2023-10-18 NOTE — Transfer of Care (Signed)
 Immediate Anesthesia Transfer of Care Note  Patient: Selena Bonilla  Procedure(s) Performed: COLONOSCOPY  Patient Location: PACU and Endoscopy Unit  Anesthesia Type:MAC and General  Level of Consciousness: awake and alert   Airway & Oxygen Therapy: Patient Spontanous Breathing and Patient connected to nasal cannula oxygen  Post-op Assessment: Report given to RN and Post -op Vital signs reviewed and stable  Post vital signs: Reviewed and stable  Last Vitals:  Vitals Value Taken Time  BP 104/60 10/18/23 1420  Temp 36.4 C 10/18/23 1420  Pulse 72 10/18/23 1420  Resp 25 10/18/23 1420  SpO2 96 % 10/18/23 1420    Last Pain:  Vitals:   10/18/23 1423  TempSrc:   PainSc: 0-No pain      Patients Stated Pain Goal: 6 (10/18/23 1101)  Complications: No notable events documented.

## 2023-10-18 NOTE — Op Note (Addendum)
 [facility] Patient Name: Selena Bonilla Procedure Date: 10/18/2023 12:18 PM MRN: [id number] Date of Birth: 1966/02/16 Attending MD: Sanjuan Dame , MD, [id number] CSN: [id number] Age: 58 Admit Type: Outpatient Procedure:                Colonoscopy Indications:              High risk colon cancer surveillance: Personal                            history of colonic polyps, High risk colon cancer                            surveillance: Personal history of sessile serrated                            colon polyp (less than 10 mm in size) with no                            dysplasia, Therapeutic procedure for colon polyps Providers:                Sanjuan Dame, MD, Sheran Fava, [place] Wilson,                            Tonya Wilson Referring MD:              Medicines:                Monitored Anesthesia Care Complications:            No immediate complications. Estimated Blood Loss:     Estimated blood loss was minimal. Procedure:                Pre-Anesthesia Assessment:                           - Prior to the procedure, a History and Physical                            was performed, and patient medications and                            allergies were reviewed. The patient's tolerance of                            previous anesthesia was also reviewed. The risks                            and benefits of the procedure and the sedation                            options and risks were discussed with the patient.                            All questions were answered, and informed consent  was obtained. Prior Anticoagulants: The patient has                            taken no anticoagulant or antiplatelet agents. ASA                            Grade Assessment: II - A patient with mild systemic                            disease. After reviewing the risks and benefits,                            the patient was deemed in satisfactory condition to                             undergo the procedure.                           After obtaining informed consent, the colonoscope                            was passed under direct vision. Throughout the                            procedure, the patient's blood pressure, pulse, and                            oxygen saturations were monitored continuously. The                            [id number]) scope was introduced through                            the anus and advanced to the the cecum, identified                            by appendiceal orifice and ileocecal valve. The                            colonoscopy was technically difficult and complex                            due to multiple diverticula in the colon. The                            patient tolerated the procedure well. The quality                            of the bowel preparation was evaluated using the                            BBPS [facility] Bowel Preparation Scale) with scores  of: Right Colon = 3, Transverse Colon = 3 and Left                            Colon = 3 (entire mucosa seen well with no residual                            staining, small fragments of stool or opaque                            liquid). The total BBPS score equals 9. The                            ileocecal valve, appendiceal orifice, and rectum                            were photographed. Scope In: 1:16:56 PM Scope Out: 2:15:48 PM Scope Withdrawal Time: 0 hours 54 minutes 20 seconds  Total Procedure Duration: 0 hours 58 minutes 52 seconds  Findings:      The perianal and digital rectal examinations were normal.      A 15 mm polyp was found in the hepatic flexure. The polyp was sessile.       Preparations were made for mucosal resection. Demarcation of the lesion       was performed with high-definition white light and narrow band imaging       to clearly identify the boundaries of the lesion. Eleview was injected        to raise the lesion. Snare mucosal resection was performed. Resection       and retrieval were complete. Resected tissue margins were examined and       clear of polyp tissue.      A 20 mm polyp was found in the proximal sigmoid colon. The polyp was       sessile. Preparations were made for mucosal resection. Demarcation of       the lesion was performed with high-definition white light and narrow       band imaging to clearly identify the boundaries of the lesion. Snare       mucosal resection was performed. Resection and retrieval were complete.       Resected tissue margins were examined and clear of polyp tissue. To       close a defect after polypectomy, two hemostatic clips were successfully       placed (MR conditional). Clip manufacturer: [employer]. There was       no bleeding at the end of the procedure.      Eight sessile polyps were found in the rectum, mid sigmoid colon and       distal sigmoid colon. The polyps were 10 to 18 mm in size. These polyps       were removed with a lift and cut technique using a cold snare. Resection       and retrieval were complete.      Scattered medium-mouthed diverticula were found in the left colon.      Non-bleeding internal hemorrhoids were found during retroflexion. The       hemorrhoids were small. Impression:               - One 15 mm polyp at the  hepatic flexure, removed                            with mucosal resection. Resected and retrieved.                           - One 20 mm polyp in the proximal sigmoid colon,                            removed with mucosal resection. Resected and                            retrieved. Clips (MR conditional) were placed. Clip                            manufacturer: [employer].                           - Eight 10 to 18 mm polyps in the rectum, in the                            mid sigmoid colon and in the distal sigmoid colon,                            removed using lift and  cut and a cold snare.                            Resected and retrieved. Underwater immersion EMR                            technique used using a clear cap                           - Diverticulosis in the left colon.                           - Non-bleeding internal hemorrhoids.                           - Mucosal resection was performed. Resection and                            retrieval were complete.                           - Mucosal resection was performed. Resection and                            retrieval were complete. Moderate Sedation:      Per Anesthesia Care Recommendation:           - Patient has a contact number available for                            emergencies. The signs and symptoms of potential  delayed complications were discussed with the                            patient. Return to normal activities tomorrow.                            Written discharge instructions were provided to the                            patient.                           - High fiber diet.                           - Continue present medications.                           - Await pathology results.                           - Repeat colonoscopy in 6 months.                           - Return to primary care physician as previously                            scheduled.                           - Patient previously with multiple SSP 3 months ago                            and now repeat colonoscopy with 10 sessile polyps                            all atleast 10mm. 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                            Suggested repeat for SSP syndrome is colonoscopy                            3-6 months until clearance achieved than q                            colonoscopy 1 years                           - No NSAIDs                           -Depending on path , FDR to given  colonoscopy age  38 years , q 5 years Procedure Code(s):        --- Professional ---                           [phone], 22, Colonoscopy, flexible; with endoscopic                            mucosal resection                           [phone], 59, Colonoscopy, flexible; with removal of                            tumor(s), polyp(s), or other lesion(s) by snare                            technique Diagnosis Code(s):        --- Professional ---                           D12.3, Benign neoplasm of transverse colon (hepatic                            flexure or splenic flexure)                           D12.5, Benign neoplasm of sigmoid colon                           D12.8, Benign neoplasm of rectum                           K64.8, Other hemorrhoids                           Z86.010, Personal history of colonic polyps                           K63.5, Polyp of colon                           K57.30, Diverticulosis of large intestine without                            perforation or abscess without bleeding CPT copyright 2022 American Medical Association. All rights reserved. The codes documented in this report are preliminary and upon coder review may  be revised to meet current compliance requirements. Sanjuan Dame, MD Sanjuan Dame, MD 10/18/2023 2:29:02 PM This report has been signed electronically. Number of Addenda: 0

## 2023-10-18 NOTE — H&P (Signed)
 Primary Care Physician:  Kirstie Peri, MD Primary Gastroenterologist:  Dr. Tasia Catchings  Pre-Procedure History & Physical: HPI: Selena Bonilla is a 58 y.o. female with no significant comorbidities who presents for evaluation for removal of colon polyps    Patient has no active GI complaints.  Patient underwent colonoscopy around 3 months ago by Dr. Blima Dessert and found to have multiple polyps and one of them sessile serrated polyp.  Reports that there was large polypoid lesions in the sigmoid colon which were not removed. The patient denies having any nausea, vomiting, fever, chills, hematochezia, melena, hematemesis, abdominal distention, abdominal pain, diarrhea, jaundice, pruritus or weight loss.   Last ZOX:WRUE Last Colonoscopy:07/2023 By Dr. Aurea Graff   Findings: A digital rectal exam was performed. It was normal. Multiple large-mouthed, medium-mouthed and small-mouthed diverticula were found in the sigmoid colon, descending colon and transverse colon. A 5 mm polyp was found at 90 cm proximal to the anus. The polyp was sessile. The polyp was removed with a cold snare. Resection was complete, but the polyp tissue was only partially retrieved. A 3 mm polyp was found at 60 cm proximal to the anus. The polyp was sessile. The polyp was removed with a cold biopsy forceps. Resection and retrieval were complete. A multiple small and large polypoid lesions were found in the sigmoid colon and in the descending colon. They were semi-pedunculated. It is possible that they were hyperplastic but were also located in an area with many diverticula. Recommend GI evaluation and management.  Impression: - Preparation of the colon was fair. A digital rectal exam was performed. It was normal. - Diverticulosis in the sigmoid colon, in the descending colon and in the transverse colon. - One 5 mm polyp at 90 cm proximal to the anus, removed with a cold snare. Complete resection. Partial retrieval. - One 3 mm  polyp at 60 cm proximal to the anus, removed with a cold biopsy forceps. Resected and retrieved. - Polypoid lesions in the sigmoid colon and in the descending colon two large and several smaller ones. Recommend GI evaluation and management Estimated Blood Loss: Estimated blood loss was minimal.  Recommendation: - Patient has a contact number available for emergencies. The signs and symptoms of potential delayed complications were discussed with the patient. Return to normal activities tomorrow. Written discharge instructions were provided to the patient. - Resume previous diet today. - Await pathology results. - Repeat colonoscopy with gastroenterology.   Path : Specimen 1: Colon, biopsy, cold snare and radial jaw   -  SESSILE SERRATED POLYP, ONE FRAGMENT.NEGATIVE FOR DYSPLASIA OR MALIGNANCY.   Specimen 2: Colon, polyp(s), polyp at 60 cm radial jaw   -  HYPERPLASTIC POLYP, ONE FRAGMENT.NEGATIVE FOR DYSPLASIA OR MALIGNANCY   FHx: neg for any gastrointestinal/liver disease, no malignancies Social: neg smoking, alcohol or illicit drug use Surgical: Hernia repair  Past Medical History:  Diagnosis Date   Asthma    Hypothyroid    PONV (postoperative nausea and vomiting)     Past Surgical History:  Procedure Laterality Date   CESAREAN SECTION     3 c sections   CHOLECYSTECTOMY     HERNIA REPAIR     LIGAMENT REPAIR Left 07/05/2022   Procedure: LIGAMENT REPAIR;  Surgeon: Bjorn Pippin, MD;  Location: [facility];  Service: Orthopedics;  Laterality: Left;   ORIF HUMERUS FRACTURE Left 07/05/2022   Procedure: OPEN REDUCTION INTERNAL FIXATION (ORIF) DISTAL HUMERUS FRACTURE;  Surgeon: Bjorn Pippin, MD;  Location: [facility];  Service: Orthopedics;  Laterality: Left;   TRICEPS TENDON REPAIR Left 07/05/2022   Procedure: TRICEPS TENDON REPAIR;  Surgeon: Bjorn Pippin, MD;  Location: [facility];  Service: Orthopedics;  Laterality: Left;     Prior to Admission medications   Medication Sig Start Date End Date Taking? Authorizing Provider  albuterol (PROVENTIL HFA;VENTOLIN HFA) 108 (90 Base) MCG/ACT inhaler Inhale into the lungs every 6 (six) hours as needed for wheezing or shortness of breath.   Yes [provider]  levothyroxine (SYNTHROID) 25 MCG tablet Take 25 mcg by mouth daily before breakfast.   Yes [provider]  Sodium Sulfate-Mag Sulfate-KCl (SUTAB) [phone] MG TABS As directed 09/13/23  Yes Katti Pelle, Juanetta Beets, MD  buPROPion (WELLBUTRIN XL) 150 MG 24 hr tablet Take 150 mg by mouth daily. 04/06/22   [provider]    Allergies as of 09/13/2023   (No Known Allergies)    Family History  Problem Relation Age of Onset   Breast cancer Neg Hx     Social History   Socioeconomic History   Marital status: Married    Spouse name: Not on file   Number of children: Not on file   Years of education: Not on file   Highest education level: Not on file  Occupational History   Not on file  Tobacco Use   Smoking status: Every Day    Current packs/day: 0.50    Types: Cigarettes    Passive exposure: Current   Smokeless tobacco: Never  Vaping Use   Vaping status: Never Used  Substance and Sexual Activity   Alcohol use: Yes    Comment: occasional   Drug use: Never   Sexual activity: Not on file  Other Topics Concern   Not on file  Social History Narrative   Not on file   Social Drivers of Health   Financial Resource Strain: Not on file  Food Insecurity: Not on file  Transportation Needs: Not on file  Physical Activity: Not on file  Stress: Not on file  Social Connections: Not on file  Intimate Partner Violence: Not on file    Review of Systems: See HPI, otherwise negative ROS  Physical Exam: Vital signs in last 24 hours: Temp:  [97.7 F (36.5 C)] 97.7 F (36.5 C) (04/09 1101) Pulse Rate:  [74] 74 (04/09 1101) Resp:  [13] 13 (04/09 1101) BP: (133)/(88) 133/88 (04/09  1101) SpO2:  [98 %] 98 % (04/09 1101) Weight:  [94.8 kg] 94.8 kg (04/09 1101)   General:   Alert,  Well-developed, well-nourished, pleasant and cooperative in NAD Head:  Normocephalic and atraumatic. Eyes:  Sclera clear, no icterus.   Conjunctiva pink. Ears:  Normal auditory acuity. Nose:  No deformity, discharge,  or lesions. Msk:  Symmetrical without gross deformities. Normal posture. Extremities:  Without clubbing or edema. Neurologic:  Alert and  oriented x4;  grossly normal neurologically. Skin:  Intact without significant lesions or rashes. Psych:  Alert and cooperative. Normal mood and affect.  Impression/Plan: Proceed with colonoscopy for removal of polyps   The risks of the procedure including infection, bleed, or perforation as well as benefits, limitations, alternatives and imponderables have been reviewed with the patient. Questions have been answered. All parties agreeable.

## 2023-10-19 ENCOUNTER — Encounter (HOSPITAL_COMMUNITY): Payer: Self-pay | Admitting: Gastroenterology

## 2023-10-19 NOTE — Anesthesia Postprocedure Evaluation (Signed)
 Anesthesia Post Note  Patient: Selena Bonilla  Procedure(s) Performed: COLONOSCOPY  Patient location during evaluation: Phase II Anesthesia Type: General Level of consciousness: awake Pain management: pain level controlled Vital Signs Assessment: post-procedure vital signs reviewed and stable Respiratory status: spontaneous breathing and respiratory function stable Cardiovascular status: blood pressure returned to baseline and stable Postop Assessment: no headache and no apparent nausea or vomiting Anesthetic complications: no Comments: Late entry   No notable events documented.   Last Vitals:  Vitals:   10/18/23 1101 10/18/23 1420  BP: 133/88 104/60  Pulse: 74 72  Resp: 13 (!) 25  Temp: 36.5 C 36.4 C  SpO2: 98% 96%    Last Pain:  Vitals:   10/18/23 1423  TempSrc:   PainSc: 0-No pain                 Windell Norfolk

## 2023-10-20 LAB — SURGICAL PATHOLOGY

## 2023-10-23 ENCOUNTER — Telehealth (INDEPENDENT_AMBULATORY_CARE_PROVIDER_SITE_OTHER): Payer: Self-pay | Admitting: *Deleted

## 2023-10-23 NOTE — Telephone Encounter (Signed)
 Seen 3/5 in office and had TCS on 10/18/23. Called because she is concerned about constipation. She said she noticed some changes after colonoscopy in January and is taking miralax one capful daily since Saturday and 2 stools Saturday and one yesterday. Had a small BM today. Like little hard balls. Noticed some pain at times when she laid on right side.   [phone]

## 2023-10-23 NOTE — Progress Notes (Signed)
 I reviewed the pathology results. [name], can you send her a letter with the findings as described below please?  Repeat colonoscopy in 1 year  Thanks,  Leiann Sporer Faizan Tyshia Fenter, MD Gastroenterology and Hepatology [facility]  ---------------------------------------------------------------------------------------------  [facility] [address], [address], [place], [place] [address] Phone:  [phone]   10/23/23 Selena Bonilla, [place]   Dear Selena Bonilla,  I am writing to inform you that the biopsies taken during your recent endoscopic examination showed:   FINAL MICROSCOPIC DIAGNOSIS:   A. COLON, HEPATIC FLEXURE, POLYPECTOMY:       Sessile serrated polyp without cytologic dysplasia.   B. RECTAL, COLON, SIGMOID, POLYPECTOMY:       Hyperplastic polyp with prolapse changes.       Negative for dysplasia.    What does this mean?  I am writing to let you know the results of your recent colonoscopy.  You had a total of 10 polyps removed. The pathology came back as "sessile serrated polyp  and hyperplastic polyps" . The biggest one was 2 cm. These findings are NOT cancer, but had the polyps remained in your colon, they could have turn into cancer.  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  I also recommend you first degree relatives ( children, brother/sister etc) to start colonoscopy at age 66.  Also I value your feedback , so if you get a survey , please take the time to fill it out and thank you for choosing [facility]  Please call us  at [phone] if you have persistent problems or have questions about your condition that have not been fully answered at this time.  Sincerely,  Opie Maclaughlin Faizan Lealand Elting, MD Gastroenterology and Hepatology

## 2023-10-24 NOTE — Telephone Encounter (Signed)
 Hi Selena Bonilla can you inform patient that for next 7-10 days she can take miralax 2 times daily to 3 times daily . It is ok if stool is loose and watery for next couple days, as we removed 10 polyps and had to place clips . Its important to prevent constipation during this time for adequate healing . Let us  know if any abdominal distention ,severe abdominal pain or fevers

## 2023-10-24 NOTE — Telephone Encounter (Signed)
 Discussed with patient per Dr. Alita Irwin - for next 7-10 days she can take miralax 2 times daily to 3 times daily . It is ok if stool is loose and watery for next couple days, as we removed 10 polyps and had to place clips . Its important to prevent constipation during this time for adequate healing . Let us  know if any abdominal distention ,severe abdominal pain or fevers   Patient verbalized understanding.

## 2023-10-26 ENCOUNTER — Encounter (INDEPENDENT_AMBULATORY_CARE_PROVIDER_SITE_OTHER): Payer: Self-pay | Admitting: *Deleted
